# Patient Record
Sex: Female | Born: 1975
Health system: Southern US, Community
[De-identification: ages and names within clinical notes are randomized; demographics above are authoritative.]

## PROBLEM LIST (undated history)

## (undated) DIAGNOSIS — J45909 Unspecified asthma, uncomplicated: Secondary | ICD-10-CM

## (undated) DIAGNOSIS — F329 Major depressive disorder, single episode, unspecified: Secondary | ICD-10-CM

## (undated) DIAGNOSIS — T7840XA Allergy, unspecified, initial encounter: Secondary | ICD-10-CM

## (undated) DIAGNOSIS — F32A Depression, unspecified: Secondary | ICD-10-CM

## (undated) DIAGNOSIS — I10 Essential (primary) hypertension: Secondary | ICD-10-CM

## (undated) DIAGNOSIS — D649 Anemia, unspecified: Secondary | ICD-10-CM

## (undated) DIAGNOSIS — C20 Malignant neoplasm of rectum: Secondary | ICD-10-CM

## (undated) DIAGNOSIS — B009 Herpesviral infection, unspecified: Secondary | ICD-10-CM

## (undated) DIAGNOSIS — F419 Anxiety disorder, unspecified: Secondary | ICD-10-CM

## (undated) HISTORY — DX: Unspecified asthma, uncomplicated: J45.909

## (undated) HISTORY — DX: Anemia, unspecified: D64.9

## (undated) HISTORY — DX: Anxiety disorder, unspecified: F41.9

## (undated) HISTORY — DX: Major depressive disorder, single episode, unspecified: F32.9

## (undated) HISTORY — DX: Allergy, unspecified, initial encounter: T78.40XA

## (undated) HISTORY — DX: Herpesviral infection, unspecified: B00.9

## (undated) HISTORY — DX: Essential (primary) hypertension: I10

## (undated) HISTORY — DX: Malignant neoplasm of rectum: C20

## (undated) HISTORY — DX: Depression, unspecified: F32.A

---

## 2011-06-29 HISTORY — PX: COLONOSCOPY: SHX174

## 2011-06-29 HISTORY — PX: SIGMOIDOSCOPY: SUR1295

## 2012-07-13 DIAGNOSIS — Z803 Family history of malignant neoplasm of breast: Secondary | ICD-10-CM | POA: Insufficient documentation

## 2015-07-28 DIAGNOSIS — M797 Fibromyalgia: Secondary | ICD-10-CM | POA: Insufficient documentation

## 2016-06-15 HISTORY — PX: LAPAROSCOPIC VAGINAL HYSTERECTOMY: SUR798

## 2016-06-15 HISTORY — PX: PARTIAL HYSTERECTOMY: SHX80

## 2018-09-06 ENCOUNTER — Encounter: Payer: Self-pay | Admitting: Osteopathic Medicine

## 2018-09-06 ENCOUNTER — Ambulatory Visit (INDEPENDENT_AMBULATORY_CARE_PROVIDER_SITE_OTHER): Payer: No Typology Code available for payment source | Admitting: Osteopathic Medicine

## 2018-09-06 ENCOUNTER — Ambulatory Visit (INDEPENDENT_AMBULATORY_CARE_PROVIDER_SITE_OTHER): Payer: No Typology Code available for payment source

## 2018-09-06 ENCOUNTER — Other Ambulatory Visit: Payer: Self-pay

## 2018-09-06 VITALS — BP 121/86 | HR 63 | Temp 98.6°F | Ht 61.0 in | Wt 178.5 lb

## 2018-09-06 DIAGNOSIS — Z8739 Personal history of other diseases of the musculoskeletal system and connective tissue: Secondary | ICD-10-CM | POA: Insufficient documentation

## 2018-09-06 DIAGNOSIS — F329 Major depressive disorder, single episode, unspecified: Secondary | ICD-10-CM

## 2018-09-06 DIAGNOSIS — R0781 Pleurodynia: Secondary | ICD-10-CM

## 2018-09-06 DIAGNOSIS — H8110 Benign paroxysmal vertigo, unspecified ear: Secondary | ICD-10-CM

## 2018-09-06 DIAGNOSIS — D3A026 Benign carcinoid tumor of the rectum: Secondary | ICD-10-CM

## 2018-09-06 DIAGNOSIS — F419 Anxiety disorder, unspecified: Secondary | ICD-10-CM

## 2018-09-06 DIAGNOSIS — I10 Essential (primary) hypertension: Secondary | ICD-10-CM | POA: Diagnosis not present

## 2018-09-06 DIAGNOSIS — F32A Depression, unspecified: Secondary | ICD-10-CM

## 2018-09-06 MED ORDER — HYDROCHLOROTHIAZIDE 25 MG PO TABS: 12.5000 mg | ORAL_TABLET | Freq: Every day | ORAL | 3 refills | Status: DC

## 2018-09-06 MED ORDER — DULOXETINE HCL 30 MG PO CPEP: 30.0000 mg | ORAL_CAPSULE | Freq: Every day | ORAL | 3 refills | Status: DC

## 2018-09-06 MED ORDER — BUPROPION HCL ER (XL) 300 MG PO TB24: 300.0000 mg | ORAL_TABLET | Freq: Every day | ORAL | 3 refills | Status: DC

## 2018-09-06 NOTE — Progress Notes (Signed)
HPI: Kelly Simmons is a 43 y.o. female who  has a past medical history of Anxiety, Asthma, Depression, Herpes, High blood pressure, and Rectal carcinoma ([place]).  she presents to [facility] today, 09/06/18,  for chief complaint of: New to establish   Has been recently got a job with [employer], patient is here to establish care due to change of insurance.  Available records reviewed.  Recent annual physical with a Lia Hopping at [facility] 07/12/2018.  Anxiety/depression/fibromyalgia: Doing well on Wellbutrin and Cymbalta.  History of positive ANA, patient had been referred to rheumatology but no appointment as of time of last physical - pt states she went in the past and w/u neg   Hypertension: Well controlled on once daily hydrochlorothiazide 25 mg.  Patient reports cough over the past couple weeks, chest pain on the right lower rib cage with deep breathing.  History of dizziness/vertigo, occasional loss of balance with sudden movements.  Former smoker, less than 1 pack/day for 4 years, quit in 2002.  Rare marijuana use.  Frequent ED oil.  About 1 alcoholic drink a week.   Family history of father with stroke in the late 30s or early 50s, sounds like he also had some other medical issues in terms of renal failure and transplant patient.  Has a history of breast cancer at 33, patient's have so far been negative.  Hx rectal carcinoid     Past medical, surgical, social and family history reviewed:  Patient Active Problem List   Diagnosis Date Noted  . Benign paroxysmal positional vertigo 09/06/2018  . Essential hypertension 09/06/2018  . Anxiety and depression 09/06/2018  . History of fibromyalgia 09/06/2018    Past Surgical History:  Procedure Laterality Date  . CESAREAN SECTION  2009  . PARTIAL HYSTERECTOMY  06/15/2016    Social History   Tobacco Use  . Smoking status: Former Smoker    Packs/day: 0.50    Years: 4.00    Pack  years: 2.00  . Smokeless tobacco: Never Used  Substance Use Topics  . Alcohol use: Yes    Family History  Problem Relation Age of Onset  . High blood pressure Mother   . Breast cancer Mother   . High blood pressure Father   . Diabetes Father   . Stroke Father   . High blood pressure Sister   . High blood pressure Brother   . Alzheimer's disease Maternal Grandmother   . Prostate cancer Maternal Grandfather   . Alzheimer's disease Paternal Grandmother   . Heart attack Paternal Grandfather      Current medication list and allergy/intolerance information reviewed:    Current Outpatient Medications  Medication Sig Dispense Refill  . albuterol (PROVENTIL HFA;VENTOLIN HFA) 108 (90 Base) MCG/ACT inhaler Inhale into the lungs.    Marland Kitchen BIOTIN PO Take by mouth.    Marland Kitchen buPROPion (WELLBUTRIN XL) 300 MG 24 hr tablet Take 1 tablet (300 mg total) by mouth daily. 90 tablet 3  . Cholecalciferol 100 MCG (4000 UT) CAPS Take by mouth.    . DULoxetine (CYMBALTA) 30 MG capsule Take 1 capsule (30 mg total) by mouth daily. 90 capsule 3  . FERROUS FUMARATE PO Take by mouth.    . hydrochlorothiazide (HYDRODIURIL) 25 MG tablet Take 0.5 tablets (12.5 mg total) by mouth daily. 90 tablet 3   No current facility-administered medications for this visit.     No Known Allergies    Review of Systems:  Constitutional:  No  fever, no chills, No recent illness, No unintentional weight changes. +significant fatigue.   HEENT: No  headache, no vision change, no hearing change, No sore throat, No  sinus pressure  Cardiac: +pleuritic chest pain per HPI, No  pressure, No palpitations, No  Orthopnea  Respiratory:  No  shortness of breath. No  Cough  Gastrointestinal: No  abdominal pain, No  nausea, No  vomiting,  No  blood in stool, No  diarrhea, No  constipation   Musculoskeletal: No new myalgia/arthralgia  Skin: No  Rash, No other wounds/concerning lesions  Genitourinary: No  incontinence, No  abnormal genital  bleeding, No abnormal genital discharge  Hem/Onc: No  easy bruising/bleeding, No  abnormal lymph node  Endocrine: No cold intolerance,  No heat intolerance. No polyuria/polydipsia/polyphagia   Neurologic: No  weakness, +dizziness, No  slurred speech/focal weakness/facial droop  Psychiatric: +concerns with depression, +concerns with anxiety, No sleep problems, No mood problems  Exam:  BP 121/86 (BP Location: Left Arm, Patient Position: Sitting, Cuff Size: Normal)   Pulse 63   Temp 98.6 F (37 C) (Oral)   Ht 5\' 1"  (1.549 m)   Wt 178 lb 8 oz (81 kg)   BMI 33.73 kg/m   Constitutional: VS see above. General Appearance: alert, well-developed, well-nourished, NAD  Eyes: Normal lids and conjunctive, non-icteric sclera  Ears, Nose, Mouth, Throat: MMM, Normal external inspection ears/nares/mouth/lips/gums. TM normal bilaterally. Pharynx/tonsils no erythema, no exudate. Nasal mucosa normal.   Neck: No masses, trachea midline. No thyroid enlargement. No tenderness/mass appreciated. No lymphadenopathy  Respiratory: Normal respiratory effort. no wheeze, no rhonchi, no rales  Cardiovascular: S1/S2 normal, no murmur, no rub/gallop auscultated. RRR. No lower extremity edema.  Gastrointestinal: Nontender, no masses. No hepatomegaly, no splenomegaly. No hernia appreciated. Bowel sounds normal. Rectal exam deferred.   Musculoskeletal: Gait normal. No clubbing/cyanosis of digits.   Neurological: Normal balance/coordination. No tremor. No cranial nerve deficit on limited exam. Motor and sensation intact and symmetric. Cerebellar reflexes intact.   Skin: warm, dry, intact. No rash/ulcer. No concerning nevi or subq nodules on limited exam.    Psychiatric: Normal judgment/insight. Normal mood and affect. Oriented x3.    No results found for this or any previous visit (from the past 72 hour(s)).  Dg Chest 2 View  Result Date: 09/06/2018 CLINICAL DATA:  Pleuritic chest pain. EXAM: CHEST - 2 VIEW  COMPARISON:  None FINDINGS: The heart size and mediastinal contours are within normal limits. Both lungs are clear. Spondylosis noted within the thoracic spine. IMPRESSION: No active cardiopulmonary disease. Electronically Signed   By: Kerby Moors M.D.   On: 09/06/2018 16:28     ASSESSMENT/PLAN: The primary encounter diagnosis was Pleuritic chest pain. Diagnoses of Benign paroxysmal positional vertigo, unspecified laterality, Essential hypertension, Anxiety and depression, History of fibromyalgia, and Carcinoid tumor of rectum, unspecified whether malignant were also pertinent to this visit.   Orders Placed This Encounter  Procedures  . DG Chest 2 View    Meds ordered this encounter  Medications  . hydrochlorothiazide (HYDRODIURIL) 25 MG tablet    Sig: Take 0.5 tablets (12.5 mg total) by mouth daily.    Dispense:  90 tablet    Refill:  3  . DULoxetine (CYMBALTA) 30 MG capsule    Sig: Take 1 capsule (30 mg total) by mouth daily.    Dispense:  90 capsule    Refill:  3  . buPROPion (WELLBUTRIN XL) 300 MG 24 hr tablet    Sig: Take 1 tablet (  300 mg total) by mouth daily.    Dispense:  90 tablet    Refill:  3    Patient Instructions  Plan:  Chest Xray today to make sure no lung abnormality  Can try cutting BP medication (HCTZ) in half from 25 mg to 12.5 mg and see if this helps the dizziness/lightheadedness   Will refill meds  Bring home BP cuff into office to verify accuracy          Visit summary with medication list and pertinent instructions was printed for patient to review. All questions at time of visit were answered - patient instructed to contact office with any additional concerns or updates. ER/RTC precautions were reviewed with the patient.    Please note: voice recognition software was used to produce this document, and typos may escape review. Please contact Dr. Sheppard Coil for any needed clarifications.     Follow-up plan: Return for annual physical when  due around 06/2019,sooner as needed / depending on how BP looking at home  .

## 2018-09-06 NOTE — Patient Instructions (Addendum)
Plan:  Chest Xray today to make sure no lung abnormality  Can try cutting BP medication (HCTZ) in half from 25 mg to 12.5 mg and see if this helps the dizziness/lightheadedness   Will refill meds  Bring home BP cuff into office to verify accuracy

## 2018-09-07 ENCOUNTER — Encounter: Payer: Self-pay | Admitting: Osteopathic Medicine

## 2018-09-07 DIAGNOSIS — Z1239 Encounter for other screening for malignant neoplasm of breast: Secondary | ICD-10-CM

## 2018-09-09 DIAGNOSIS — D3A026 Benign carcinoid tumor of the rectum: Secondary | ICD-10-CM | POA: Insufficient documentation

## 2018-09-13 MED FILL — HYDROCHLOROTHIAZIDE 25 MG T: 25 | 30 days supply | Qty: 30 | Fill #0

## 2018-09-13 MED FILL — buPROPion HCL ER (XL) 300 M: 300 | 90 days supply | Qty: 90 | Fill #0

## 2018-09-17 ENCOUNTER — Encounter: Payer: Self-pay | Admitting: Osteopathic Medicine

## 2018-09-19 MED FILL — DULoxetine HCL 30 MG CPEP: 30 | 90 days supply | Qty: 90 | Fill #0

## 2018-11-08 ENCOUNTER — Ambulatory Visit (INDEPENDENT_AMBULATORY_CARE_PROVIDER_SITE_OTHER): Payer: No Typology Code available for payment source

## 2018-11-08 ENCOUNTER — Other Ambulatory Visit: Payer: Self-pay

## 2018-11-08 DIAGNOSIS — Z1239 Encounter for other screening for malignant neoplasm of breast: Secondary | ICD-10-CM | POA: Diagnosis not present

## 2018-11-21 MED FILL — HYDROCHLOROTHIAZIDE 25 MG T: 25 | 60 days supply | Qty: 60 | Fill #0

## 2019-02-08 MED FILL — HYDROCHLOROTHIAZIDE 25 MG T: 25 | 90 days supply | Qty: 90 | Fill #0

## 2019-02-08 MED FILL — DULoxetine HCL 30 MG CPEP: 30 | 90 days supply | Qty: 90 | Fill #0

## 2019-02-08 MED FILL — buPROPion HCL ER (XL) 300 M: 300 | 90 days supply | Qty: 90 | Fill #0

## 2019-03-28 ENCOUNTER — Ambulatory Visit (INDEPENDENT_AMBULATORY_CARE_PROVIDER_SITE_OTHER): Payer: No Typology Code available for payment source | Admitting: Sports Medicine

## 2019-03-28 DIAGNOSIS — Z23 Encounter for immunization: Secondary | ICD-10-CM

## 2019-05-01 ENCOUNTER — Other Ambulatory Visit: Payer: Self-pay

## 2019-05-01 DIAGNOSIS — Z20822 Contact with and (suspected) exposure to covid-19: Secondary | ICD-10-CM

## 2019-05-02 LAB — NOVEL CORONAVIRUS, NAA: SARS-CoV-2, NAA: NOT DETECTED

## 2019-07-04 MED FILL — HYDROCHLOROTHIAZIDE 25 MG T: 25 | 90 days supply | Qty: 90 | Fill #1

## 2019-07-04 MED FILL — DULoxetine HCL 30 MG CPEP: 30 | 90 days supply | Qty: 90 | Fill #1

## 2019-09-19 ENCOUNTER — Encounter: Payer: Self-pay | Admitting: Osteopathic Medicine

## 2019-09-19 ENCOUNTER — Other Ambulatory Visit: Payer: Self-pay

## 2019-09-19 ENCOUNTER — Ambulatory Visit (INDEPENDENT_AMBULATORY_CARE_PROVIDER_SITE_OTHER): Payer: No Typology Code available for payment source | Admitting: Osteopathic Medicine

## 2019-09-19 ENCOUNTER — Other Ambulatory Visit: Payer: Self-pay | Admitting: Osteopathic Medicine

## 2019-09-19 VITALS — BP 123/83 | HR 67 | Temp 98.2°F | Wt 180.1 lb

## 2019-09-19 DIAGNOSIS — F329 Major depressive disorder, single episode, unspecified: Secondary | ICD-10-CM

## 2019-09-19 DIAGNOSIS — Z13220 Encounter for screening for lipoid disorders: Secondary | ICD-10-CM

## 2019-09-19 DIAGNOSIS — R5382 Chronic fatigue, unspecified: Secondary | ICD-10-CM | POA: Diagnosis not present

## 2019-09-19 DIAGNOSIS — Z0000 Encounter for general adult medical examination without abnormal findings: Secondary | ICD-10-CM | POA: Diagnosis not present

## 2019-09-19 DIAGNOSIS — F419 Anxiety disorder, unspecified: Secondary | ICD-10-CM | POA: Diagnosis not present

## 2019-09-19 DIAGNOSIS — F32A Depression, unspecified: Secondary | ICD-10-CM

## 2019-09-19 MED ORDER — BUPROPION HCL ER (XL) 300 MG PO TB24: 300.0000 mg | ORAL_TABLET | Freq: Every day | ORAL | 3 refills | Status: DC

## 2019-09-19 MED ORDER — HYDROCHLOROTHIAZIDE 25 MG PO TABS: 12.5000 mg | ORAL_TABLET | Freq: Every day | ORAL | 3 refills | Status: DC

## 2019-09-19 MED ORDER — DULOXETINE HCL 30 MG PO CPEP: 30.0000 mg | ORAL_CAPSULE | Freq: Every day | ORAL | 3 refills | Status: DC

## 2019-09-19 MED FILL — DULoxetine HCL 30 MG CPEP: 30 | 90 days supply | Qty: 90 | Fill #0

## 2019-09-19 MED FILL — buPROPion HCL ER (XL) 300 M: 300 | 90 days supply | Qty: 90 | Fill #0

## 2019-09-19 MED FILL — HYDROCHLOROTHIAZIDE 25 MG T: 25 | 90 days supply | Qty: 90 | Fill #0

## 2019-09-19 NOTE — Patient Instructions (Addendum)
General Preventive Care  Most recent routine screening lipids/other labs: ordered    Everyone should have blood pressure checked once per year.   Tobacco: don't!   Alcohol: responsible moderation is ok for most adults - if you have concerns about your alcohol intake, please talk to me!   Exercise: as tolerated to reduce risk of cardiovascular disease and diabetes. Strength training will also prevent osteoporosis.   Mental health: if need for mental health care (medicines, counseling, other), or concerns about moods, please let me know!   Sexual health: if need for STD testing, or if concerns with libido/pain problems, please let me know!   Advanced Directive: Living Will and/or Healthcare Power of Attorney recommended for all adults, regardless of age or health.  Vaccines  Flu vaccine: recommended for almost everyone, every fall.   Shingles vaccine: Shingrix  age 33.  Pneumonia vaccines: Prevnar and Pneumovax age 26  Tetanus booster: Tdap recommended every 10 years. Due 2030  COVID vaccine: as scheduled! Thanks for getting your vaccine!  Cancer screenings   Colon cancer screening: age 40!  Breast cancer screening: mammogram at age 79 every other year at least, and annually after age 36.   Cervical cancer screening: Pap every 1 to 5 years depending on age and other risk factors. Can usually stop at age 33 or w/ hysterectomy.   Lung cancer screening: CT chest every year for those sge 40 to 46 years old with ?30 pack year smoking history, who either currently smoke or have quit within the past 15 years. Infection screenings . HIV: recommended screening at least once age 7-65, more often as needed. . Gonorrhea/Chlamydia: screening as needed . Hepatitis C: recommended for anyone born 34-1967 . TB: certain at-risk populations, or depending on work requirements and/or travel history Other . Bone Density Test: for women at age 47

## 2019-09-19 NOTE — Progress Notes (Signed)
HPI: Kelly Simmons is a 44 y.o. female who  has a past medical history of Anxiety, Asthma, Depression, Herpes, High blood pressure, and Rectal carcinoma ([place]).  she presents to [facility] today, 09/19/19,  for chief complaint of: Annual physical  Fatigue    Past medical, surgical, social and family history reviewed:  Patient Active Problem List   Diagnosis Date Noted  . Carcinoid tumor of rectum 09/09/2018  . Benign paroxysmal positional vertigo 09/06/2018  . Essential hypertension 09/06/2018  . Anxiety and depression 09/06/2018  . History of fibromyalgia 09/06/2018    Past Surgical History:  Procedure Laterality Date  . CESAREAN SECTION  2009  . PARTIAL HYSTERECTOMY  06/15/2016    Social History   Tobacco Use  . Smoking status: Former Smoker    Packs/day: 0.50    Years: 4.00    Pack years: 2.00  . Smokeless tobacco: Never Used  Substance Use Topics  . Alcohol use: Yes    Family History  Problem Relation Age of Onset  . High blood pressure Mother   . Breast cancer Mother   . High blood pressure Father   . Diabetes Father   . Stroke Father   . High blood pressure Sister   . High blood pressure Brother   . Alzheimer's disease Maternal Grandmother   . Prostate cancer Maternal Grandfather   . Alzheimer's disease Paternal Grandmother   . Heart attack Paternal Grandfather      Current medication list and allergy/intolerance information reviewed:    Current Outpatient Medications  Medication Sig Dispense Refill  . albuterol (PROVENTIL HFA;VENTOLIN HFA) 108 (90 Base) MCG/ACT inhaler Inhale into the lungs.    Marland Kitchen BIOTIN PO Take by mouth.    Marland Kitchen buPROPion (WELLBUTRIN XL) 300 MG 24 hr tablet Take 1 tablet (300 mg total) by mouth daily. 90 tablet 3  . Cholecalciferol 100 MCG (4000 UT) CAPS Take by mouth.    . DULoxetine (CYMBALTA) 30 MG capsule Take 1 capsule (30 mg total) by mouth daily. 90 capsule 3  . FERROUS FUMARATE PO Take  by mouth.    . hydrochlorothiazide (HYDRODIURIL) 25 MG tablet Take 0.5 tablets (12.5 mg total) by mouth daily. 90 tablet 3   No current facility-administered medications for this visit.    No Known Allergies     Exam:  BP 123/83 (BP Location: Left Arm, Patient Position: Sitting, Cuff Size: Normal)   Pulse 67   Temp 98.2 F (36.8 C) (Oral)   Wt 180 lb 1.9 oz (81.7 kg)   BMI 34.03 kg/m   Constitutional: VS see above. General Appearance: alert, well-developed, well-nourished, NAD  Eyes: Normal lids and conjunctive, non-icteric sclera  Ears, Nose, Mouth, Throat: . TM normal bilaterally.   Neck: No masses, trachea midline. No thyroid enlargement. No tenderness/mass appreciated. No lymphadenopathy  Respiratory: Normal respiratory effort. no wheeze, no rhonchi, no rales  Cardiovascular: S1/S2 normal, no murmur, no rub/gallop auscultated. RRR. No lower extremity edema.   Gastrointestinal: Nontender, no masses. No hepatomegaly, no splenomegaly. No hernia appreciated. Bowel sounds normal. Rectal exam deferred.   Musculoskeletal: Gait normal. No clubbing/cyanosis of digits.   Neurological: Normal balance/coordination. No tremor. No cranial nerve deficit on limited exam. Motor and sensation intact and symmetric. Cerebellar reflexes intact.   Skin: warm, dry, intact. No rash/ulcer. No concerning nevi or subq nodules on limited exam.    Psychiatric: Normal judgment/insight. Normal mood and affect. Oriented x3.    No results found for this  or any previous visit (from the past 72 hour(s)).  No results found.   ASSESSMENT/PLAN: The primary encounter diagnosis was Annual physical exam. Diagnoses of Chronic fatigue, Lipid screening, and Anxiety and depression were also pertinent to this visit.   Orders Placed This Encounter  Procedures  . CBC  . COMPLETE METABOLIC PANEL WITH GFR  . Lipid panel  . TSH  . Hemoglobin A1c    Meds ordered this encounter  Medications  . buPROPion  (WELLBUTRIN XL) 300 MG 24 hr tablet    Sig: Take 1 tablet (300 mg total) by mouth daily.    Dispense:  90 tablet    Refill:  3  . DULoxetine (CYMBALTA) 30 MG capsule    Sig: Take 1 capsule (30 mg total) by mouth daily.    Dispense:  90 capsule    Refill:  3  . hydrochlorothiazide (HYDRODIURIL) 25 MG tablet    Sig: Take 0.5 tablets (12.5 mg total) by mouth daily.    Dispense:  90 tablet    Refill:  3    Patient Instructions  General Preventive Care  Most recent routine screening lipids/other labs: ordered    Everyone should have blood pressure checked once per year.   Tobacco: don't!   Alcohol: responsible moderation is ok for most adults - if you have concerns about your alcohol intake, please talk to me!   Exercise: as tolerated to reduce risk of cardiovascular disease and diabetes. Strength training will also prevent osteoporosis.   Mental health: if need for mental health care (medicines, counseling, other), or concerns about moods, please let me know!   Sexual health: if need for STD testing, or if concerns with libido/pain problems, please let me know!   Advanced Directive: Living Will and/or Healthcare Power of Attorney recommended for all adults, regardless of age or health.  Vaccines  Flu vaccine: recommended for almost everyone, every fall.   Shingles vaccine: Shingrix  age 60.  Pneumonia vaccines: Prevnar and Pneumovax age 72  Tetanus booster: Tdap recommended every 10 years. Due 2030  COVID vaccine: as scheduled! Thanks for getting your vaccine!  Cancer screenings   Colon cancer screening: age 32!  Breast cancer screening: mammogram at age 47 every other year at least, and annually after age 59.   Cervical cancer screening: Pap every 1 to 5 years depending on age and other risk factors. Can usually stop at age 44 or w/ hysterectomy.   Lung cancer screening: CT chest every year for those sge 63 to 46 years old with ?30 pack year smoking history, who either  currently smoke or have quit within the past 15 years. Infection screenings . HIV: recommended screening at least once age 73-65, more often as needed. . Gonorrhea/Chlamydia: screening as needed . Hepatitis C: recommended for anyone born 35-1967 . TB: certain at-risk populations, or depending on work requirements and/or travel history Other . Bone Density Test: for women at age 15        Visit summary with medication list and pertinent instructions was printed for patient to review. All questions at time of visit were answered - patient instructed to contact office with any additional concerns or updates. ER/RTC precautions were reviewed with the patient.    Please note: voice recognition software was used to produce this document, and typos may escape review. Please contact Dr. Sheppard Coil for any needed clarifications.     Follow-up plan: Return in about 1 week (around 09/26/2019) for [place] (call week prior to visit  for lab orders).

## 2019-09-21 ENCOUNTER — Encounter: Payer: Self-pay | Admitting: Osteopathic Medicine

## 2019-09-22 LAB — IRON,TIBC AND FERRITIN PANEL
%SAT: 20 % (calc) (ref 16–45)
Ferritin: 48 ng/mL (ref 16–232)
Iron: 55 ug/dL (ref 40–190)
TIBC: 279 mcg/dL (calc) (ref 250–450)

## 2019-09-22 LAB — TSH: TSH: 0.55 mIU/L

## 2019-09-22 LAB — HEMOGLOBIN A1C
Hgb A1c MFr Bld: 5.4 % of total Hgb (ref <5.7)
Mean Plasma Glucose: 108 (calc)
eAG (mmol/L): 6 (calc)

## 2019-09-22 LAB — COMPLETE METABOLIC PANEL WITH GFR
AG Ratio: 1.7 (calc) (ref 1.0–2.5)
ALT: 10 U/L (ref 6–29)
AST: 14 U/L (ref 10–35)
Albumin: 4.5 g/dL (ref 3.6–5.1)
Alkaline phosphatase (APISO): 44 U/L (ref 31–125)
BUN: 14 mg/dL (ref 7–25)
CO2: 30 mmol/L (ref 20–32)
Calcium: 9.7 mg/dL (ref 8.6–10.2)
Chloride: 99 mmol/L (ref 98–110)
Creat: 1.03 mg/dL (ref 0.50–1.10)
GFR, Est African American: 76 mL/min/{1.73_m2} (ref >=60)
GFR, Est Non African American: 66 mL/min/{1.73_m2} (ref >=60)
Globulin: 2.6 g/dL (calc) (ref 1.9–3.7)
Glucose, Bld: 86 mg/dL (ref 65–139)
Potassium: 3.6 mmol/L (ref 3.5–5.3)
Sodium: 137 mmol/L (ref 135–146)
Total Bilirubin: 0.3 mg/dL (ref 0.2–1.2)
Total Protein: 7.1 g/dL (ref 6.1–8.1)

## 2019-09-22 LAB — CBC
HCT: 39.8 % (ref 35.0–45.0)
Hemoglobin: 12.7 g/dL (ref 11.7–15.5)
MCH: 24 pg — ABNORMAL LOW (ref 27.0–33.0)
MCHC: 31.9 g/dL — ABNORMAL LOW (ref 32.0–36.0)
MCV: 75.2 fL — ABNORMAL LOW (ref 80.0–100.0)
MPV: 11.5 fL (ref 7.5–12.5)
Platelets: 382 10*3/uL (ref 140–400)
RBC: 5.29 10*6/uL — ABNORMAL HIGH (ref 3.80–5.10)
RDW: 14.4 % (ref 11.0–15.0)
WBC: 4.8 10*3/uL (ref 3.8–10.8)

## 2019-09-22 LAB — LIPID PANEL
Cholesterol: 221 mg/dL — ABNORMAL HIGH (ref <200)
HDL: 58 mg/dL (ref >=50)
LDL Cholesterol (Calc): 140 mg/dL (calc) — ABNORMAL HIGH
Non-HDL Cholesterol (Calc): 163 mg/dL (calc) — ABNORMAL HIGH (ref <130)
Total CHOL/HDL Ratio: 3.8 (calc) (ref <5.0)
Triglycerides: 116 mg/dL (ref <150)

## 2019-09-26 ENCOUNTER — Other Ambulatory Visit: Payer: Self-pay | Admitting: Osteopathic Medicine

## 2019-09-26 DIAGNOSIS — Z1231 Encounter for screening mammogram for malignant neoplasm of breast: Secondary | ICD-10-CM

## 2019-10-11 ENCOUNTER — Encounter: Payer: Self-pay | Admitting: Osteopathic Medicine

## 2019-10-11 DIAGNOSIS — Z1211 Encounter for screening for malignant neoplasm of colon: Secondary | ICD-10-CM

## 2019-10-11 NOTE — Telephone Encounter (Signed)
Routing to provider  

## 2019-10-29 ENCOUNTER — Other Ambulatory Visit: Payer: Self-pay

## 2019-10-29 ENCOUNTER — Encounter: Payer: Self-pay | Admitting: Obstetrics & Gynecology

## 2019-10-29 ENCOUNTER — Other Ambulatory Visit (HOSPITAL_COMMUNITY)
Admission: RE | Admit: 2019-10-29 | Discharge: 2019-10-29 | Disposition: A | Discharge status: Home / Self Care | Payer: No Typology Code available for payment source | Source: Ambulatory Visit | Attending: Obstetrics & Gynecology | Admitting: Obstetrics & Gynecology

## 2019-10-29 ENCOUNTER — Ambulatory Visit (INDEPENDENT_AMBULATORY_CARE_PROVIDER_SITE_OTHER): Payer: No Typology Code available for payment source | Admitting: Obstetrics & Gynecology

## 2019-10-29 VITALS — BP 133/87 | HR 64 | Wt 180.0 lb

## 2019-10-29 DIAGNOSIS — Z01419 Encounter for gynecological examination (general) (routine) without abnormal findings: Secondary | ICD-10-CM | POA: Diagnosis not present

## 2019-10-29 DIAGNOSIS — Z90710 Acquired absence of both cervix and uterus: Secondary | ICD-10-CM | POA: Diagnosis not present

## 2019-10-29 NOTE — Patient Instructions (Signed)

## 2019-10-29 NOTE — Progress Notes (Signed)
GYNECOLOGY ANNUAL PREVENTATIVE CARE ENCOUNTER NOTE  History:     Kelly Simmons is a 44 y.o. female here for a routine annual gynecologic exam and to establish care.  Current complaints: none. Had LAVH in 2017 for AUB, adneomyosis; ovaries are still in place.   Denies abnormal vaginal bleeding, discharge, pelvic pain, problems with intercourse or other gynecologic concerns.    Gynecologic History No LMP recorded. Patient has had a hysterectomy. Contraception: status post hysterectomy Last Pap: 2020. Results were: normal with negative HPV Last mammogram: 2020. Results were: normal  Obstetric History OB History  No obstetric history on file.    Past Medical History:  Diagnosis Date  . Anxiety   . Asthma   . Depression   . Herpes   . High blood pressure   . Rectal carcinoma [facility])     Past Surgical History:  Procedure Laterality Date  . CESAREAN SECTION  2009  . LAPAROSCOPIC VAGINAL HYSTERECTOMY  06/15/2016   Ovaries still in place    Current Outpatient Medications on File Prior to Visit  Medication Sig Dispense Refill  . albuterol (PROVENTIL HFA;VENTOLIN HFA) 108 (90 Base) MCG/ACT inhaler Inhale into the lungs.    Marland Kitchen buPROPion (WELLBUTRIN XL) 300 MG 24 hr tablet Take 1 tablet (300 mg total) by mouth daily. 90 tablet 3  . Cholecalciferol 100 MCG (4000 UT) CAPS Take by mouth.    . DULoxetine (CYMBALTA) 30 MG capsule Take 1 capsule (30 mg total) by mouth daily. 90 capsule 3  . FERROUS FUMARATE PO Take by mouth.    . hydrochlorothiazide (HYDRODIURIL) 25 MG tablet Take 0.5 tablets (12.5 mg total) by mouth daily. 90 tablet 3  . BIOTIN PO Take by mouth.     No current facility-administered medications on file prior to visit.    No Known Allergies  Social History:  reports that she has quit smoking. She has a 2.00 pack-year smoking history. She has never used smokeless tobacco. She reports current alcohol use. She reports current drug use. Drug: Marijuana.  Family  History  Problem Relation Age of Onset  . High blood pressure Mother   . Breast cancer Mother   . High blood pressure Father   . Diabetes Father   . Stroke Father   . High blood pressure Sister   . High blood pressure Brother   . Alzheimer's disease Maternal Grandmother   . Prostate cancer Maternal Grandfather   . Alzheimer's disease Paternal Grandmother   . Heart attack Paternal Grandfather     The following portions of the patient's history were reviewed and updated as appropriate: allergies, current medications, past family history, past medical history, past social history, past surgical history and problem list.  Review of Systems Pertinent items noted in HPI and remainder of comprehensive ROS otherwise negative.  Physical Exam:  BP 133/87   Pulse 64   Wt 180 lb (81.6 kg)   BMI 34.01 kg/m  CONSTITUTIONAL: Well-developed, well-nourished female in no acute distress.  HENT:  Normocephalic, atraumatic, External right and left ear normal. Oropharynx is clear and moist EYES: Conjunctivae and EOM are normal. Pupils are equal, round, and reactive to light. No scleral icterus.  NECK: Normal range of motion, supple, no masses.  Normal thyroid.  SKIN: Skin is warm and dry. No rash noted. Not diaphoretic. No erythema. No pallor. MUSCULOSKELETAL: Normal range of motion. No tenderness.  No cyanosis, clubbing, or edema.  2+ distal pulses. NEUROLOGIC: Alert and oriented to person, place, and  time. Normal reflexes, muscle tone coordination.  PSYCHIATRIC: Normal mood and affect. Normal behavior. Normal judgment and thought content. CARDIOVASCULAR: Normal heart rate noted, regular rhythm RESPIRATORY: Clear to auscultation bilaterally. Effort and breath sounds normal, no problems with respiration noted. BREASTS: Symmetric in size. No masses, tenderness, skin changes, nipple drainage, or lymphadenopathy bilaterally. Performed in the presence of a chaperone. ABDOMEN: Soft, no distention noted.   No tenderness, rebound or guarding.  PELVIC: Normal appearing external genitalia and urethral meatus; normal appearing vaginal mucosa and well-healed vaginal cuff.  White discharge noted., testing sample obtained.  No palpable masses, no adnexal tenderness.  Performed in the presence of a chaperone.   Assessment and Plan:      1. Well woman exam with routine gynecological exam 2. S/P laparoscopic assisted vaginal hysterectomy (LAVH) LAVH done for benign indications, no need for further pap smear/cervical cancer screening. Patient desires annual STI screen. - HIV Antibody (routine testing w rflx) - Hepatitis C Antibody - RPR - Cervicovaginal ancillary only( [place]) Will follow up results and manage accordingly. Mammogram scheduled for 5/21/202.1 Routine preventative health maintenance measures emphasized. Please refer to After Visit Summary for other counseling recommendations.      Verita Schneiders, MD, [place] for [employer], [place]

## 2019-10-30 LAB — CERVICOVAGINAL ANCILLARY ONLY
Bacterial Vaginitis (gardnerella): NEGATIVE
Candida Glabrata: NEGATIVE
Candida Vaginitis: NEGATIVE
Chlamydia: NEGATIVE
Comment: NEGATIVE
Comment: NEGATIVE
Comment: NEGATIVE
Comment: NEGATIVE
Comment: NEGATIVE
Comment: NORMAL
Neisseria Gonorrhea: NEGATIVE
Trichomonas: NEGATIVE

## 2019-10-30 LAB — HEPATITIS C ANTIBODY
Hepatitis C Ab: NONREACTIVE
SIGNAL TO CUT-OFF: 0.01 (ref <1.00)

## 2019-10-30 LAB — HIV ANTIBODY (ROUTINE TESTING W REFLEX): HIV 1&2 Ab, 4th Generation: NONREACTIVE

## 2019-10-30 LAB — RPR: RPR Ser Ql: NONREACTIVE

## 2019-11-14 ENCOUNTER — Other Ambulatory Visit: Payer: Self-pay

## 2019-11-14 ENCOUNTER — Ambulatory Visit (INDEPENDENT_AMBULATORY_CARE_PROVIDER_SITE_OTHER): Payer: No Typology Code available for payment source

## 2019-11-14 DIAGNOSIS — Z1231 Encounter for screening mammogram for malignant neoplasm of breast: Secondary | ICD-10-CM

## 2020-02-20 ENCOUNTER — Encounter: Payer: Self-pay | Admitting: Osteopathic Medicine

## 2020-02-20 DIAGNOSIS — K921 Melena: Secondary | ICD-10-CM

## 2020-02-20 DIAGNOSIS — Z1211 Encounter for screening for malignant neoplasm of colon: Secondary | ICD-10-CM

## 2020-02-20 DIAGNOSIS — D3A026 Benign carcinoid tumor of the rectum: Secondary | ICD-10-CM

## 2020-02-28 ENCOUNTER — Encounter: Payer: Self-pay | Admitting: Gastroenterology

## 2020-03-07 ENCOUNTER — Emergency Department (INDEPENDENT_AMBULATORY_CARE_PROVIDER_SITE_OTHER)
Admission: RE | Admit: 2020-03-07 | Discharge: 2020-03-07 | Disposition: A | Discharge status: Home / Self Care | Payer: No Typology Code available for payment source | Source: Ambulatory Visit

## 2020-03-07 ENCOUNTER — Other Ambulatory Visit: Payer: Self-pay

## 2020-03-07 VITALS — BP 137/88 | HR 75 | Temp 99.0°F | Resp 20 | Ht 61.0 in | Wt 150.0 lb

## 2020-03-07 DIAGNOSIS — J069 Acute upper respiratory infection, unspecified: Secondary | ICD-10-CM

## 2020-03-07 DIAGNOSIS — R197 Diarrhea, unspecified: Secondary | ICD-10-CM

## 2020-03-07 NOTE — ED Triage Notes (Addendum)
Cough, congestion, diarrhea, sore throat x 5 days Vaccinated

## 2020-03-07 NOTE — ED Provider Notes (Signed)
[name]    CSN: [id number] Arrival date & time: 03/07/20  0904      History   Chief Complaint Chief Complaint  Patient presents with  . Cough    HPI Kelly Simmons is a 44 y.o. female.   HPI  Kelly Simmons is a 44 y.o. female presenting to UC with c/o 5 days of nasal congestion, mildly productive cough, sore throat and 1 episode of watery diarrhea yesterday. Pt received the [employer] vaccine in April 2021.  She notes her 59yo and 69yo son have been sick this past week as well, tested negative for COVID.  Pt denies fever, chills, n/v but had a temp of 99*F in triage.    Past Medical History:  Diagnosis Date  . Anxiety   . Asthma   . Depression   . Herpes   . High blood pressure   . Rectal carcinoma [facility])     Patient Active Problem List   Diagnosis Date Noted  . Carcinoid tumor of rectum 09/09/2018  . Benign paroxysmal positional vertigo 09/06/2018  . Essential hypertension 09/06/2018  . Anxiety and depression 09/06/2018  . History of fibromyalgia 09/06/2018    Past Surgical History:  Procedure Laterality Date  . CESAREAN SECTION  2009  . LAPAROSCOPIC VAGINAL HYSTERECTOMY  06/15/2016   Ovaries still in place    OB History   No obstetric history on file.      Home Medications    Prior to Admission medications   Medication Sig Start Date End Date Taking? Authorizing Provider  albuterol (PROVENTIL HFA;VENTOLIN HFA) 108 (90 Base) MCG/ACT inhaler Inhale into the lungs. 06/02/15   [provider]  BIOTIN PO Take by mouth.    [provider]  buPROPion (WELLBUTRIN XL) 300 MG 24 hr tablet Take 1 tablet (300 mg total) by mouth daily. 09/19/19   Emeterio Reeve, DO  Cholecalciferol 100 MCG (4000 UT) CAPS Take by mouth. 07/04/17   [provider]  DULoxetine (CYMBALTA) 30 MG capsule Take 1 capsule (30 mg total) by mouth daily. 09/19/19   Emeterio Reeve, DO  FERROUS FUMARATE PO Take by mouth.    [provider]  hydrochlorothiazide (HYDRODIURIL) 25 MG tablet Take 0.5 tablets (12.5 mg total) by mouth daily. 09/19/19   Emeterio Reeve, DO    Family History Family History  Problem Relation Age of Onset  . High blood pressure Mother   . Breast cancer Mother   . High blood pressure Father   . Diabetes Father   . Stroke Father   . High blood pressure Sister   . High blood pressure Brother   . Alzheimer's disease Maternal Grandmother   . Prostate cancer Maternal Grandfather   . Alzheimer's disease Paternal Grandmother   . Heart attack Paternal Grandfather     Social History Social History   Tobacco Use  . Smoking status: Former Smoker    Packs/day: 0.50    Years: 4.00    Pack years: 2.00  . Smokeless tobacco: Never Used  Vaping Use  . Vaping Use: Every day  Substance Use Topics  . Alcohol use: Yes  . Drug use: Yes    Types: Marijuana     Allergies   Patient has no known allergies.   Review of Systems Review of Systems  Constitutional: Negative for chills and fever.  HENT: Positive for congestion and sore throat. Negative for ear pain, trouble swallowing and voice change.   Respiratory: Positive for cough. Negative for  shortness of breath.   Cardiovascular: Negative for chest pain and palpitations.  Gastrointestinal: Positive for diarrhea. Negative for abdominal pain, nausea and vomiting.  Musculoskeletal: Negative for arthralgias, back pain and myalgias.  Skin: Negative for rash.  Neurological: Negative for dizziness, light-headedness and headaches.  All other systems reviewed and are negative.    Physical Exam Triage Vital Signs ED Triage Vitals  Enc Vitals Group     BP 03/07/20 0924 137/88     Pulse Rate 03/07/20 0924 75     Resp 03/07/20 0925 20     Temp 03/07/20 0924 99 F (37.2 C)     Temp Source 03/07/20 0924 Oral     SpO2 03/07/20 0925 98 %     Weight 03/07/20 0925 150 lb (68 kg)     Height 03/07/20 0925 5\' 1"  (1.549 m)     Head Circumference --       Peak Flow --      Pain Score 03/07/20 0925 0     Pain Loc --      Pain Edu? --      Excl. in [place]? --    No data found.  Updated Vital Signs BP 137/88   Pulse 75   Temp 99 F (37.2 C) (Oral)   Resp 20   Ht 5\' 1"  (1.549 m)   Wt 150 lb (68 kg)   SpO2 98%   BMI 28.34 kg/m   Visual Acuity Right Eye Distance:   Left Eye Distance:   Bilateral Distance:    Right Eye Near:   Left Eye Near:    Bilateral Near:     Physical Exam Vitals and nursing note reviewed.  Constitutional:      General: She is not in acute distress.    Appearance: Normal appearance. She is well-developed. She is not ill-appearing, toxic-appearing or diaphoretic.  HENT:     Head: Normocephalic and atraumatic.     Right Ear: Tympanic membrane and ear canal normal.     Left Ear: Tympanic membrane and ear canal normal.     Nose: Nose normal.     Right Sinus: No maxillary sinus tenderness or frontal sinus tenderness.     Left Sinus: No maxillary sinus tenderness or frontal sinus tenderness.     Mouth/Throat:     Lips: Pink.     Mouth: Mucous membranes are moist.     Pharynx: Oropharynx is clear. Uvula midline.  Cardiovascular:     Rate and Rhythm: Normal rate and regular rhythm.  Pulmonary:     Effort: Pulmonary effort is normal. No respiratory distress.     Breath sounds: Normal breath sounds. No stridor. No wheezing, rhonchi or rales.  Abdominal:     General: There is no distension.     Palpations: Abdomen is soft.     Tenderness: There is no abdominal tenderness. There is no right CVA tenderness or left CVA tenderness.  Musculoskeletal:        General: Normal range of motion.     Cervical back: Normal range of motion.  Skin:    General: Skin is warm and dry.  Neurological:     Mental Status: She is alert and oriented to person, place, and time.  Psychiatric:        Behavior: Behavior normal.      UC Treatments / Results  Labs (all labs ordered are listed, but only abnormal results are  displayed) Labs Reviewed  SARS-COV-2 RNA,(COVID-19) QUALITATIVE NAAT    EKG  Radiology No results found.  Procedures Procedures (including critical care time)  Medications Ordered in UC Medications - No data to display  Initial Impression / Assessment and Plan / UC Course  I have reviewed the triage vital signs and the nursing notes.  Pertinent labs & imaging results that were available during my care of the patient were reviewed by me and considered in my medical decision making (see chart for details).     Pt appears well, NAD Normal abdominal exam No evidence of bacterial infection at this time. Encouraged f/u with PCP as needed COVID test sent to r/o breakthrough case AVS given  Final Clinical Impressions(s) / UC Diagnoses   Final diagnoses:  Acute upper respiratory infection  Diarrhea, unspecified type     Discharge Instructions      You may take 500mg  acetaminophen every 4-6 hours or in combination with ibuprofen 400-600mg  every 6-8 hours as needed for pain, inflammation, and fever.  Be sure to well hydrated with clear liquids and get at least 8 hours of sleep at night, preferably more while sick.   Please follow up with family medicine in 4-5 days if not improving, sooner if worsening.  You will be notified of only POSITIVE test results in 2-4 days. If negative, you can view your results on your free [facility] MyChart account.      ED Prescriptions    None     PDMP not reviewed this encounter.   Noe Gens, [place] 03/07/20 1036

## 2020-03-07 NOTE — Discharge Instructions (Signed)
  You may take 500mg  acetaminophen every 4-6 hours or in combination with ibuprofen 400-600mg  every 6-8 hours as needed for pain, inflammation, and fever.  Be sure to well hydrated with clear liquids and get at least 8 hours of sleep at night, preferably more while sick.   Please follow up with family medicine in 4-5 days if not improving, sooner if worsening.  You will be notified of only POSITIVE test results in 2-4 days. If negative, you can view your results on your free [facility] MyChart account.

## 2020-03-08 LAB — SARS-COV-2 RNA,(COVID-19) QUALITATIVE NAAT: SARS CoV2 RNA: NOT DETECTED

## 2020-03-18 MED FILL — HYDROCHLOROTHIAZIDE 25 MG T: 25 | 90 days supply | Qty: 90 | Fill #1

## 2020-03-18 MED FILL — buPROPion HCL ER (XL) 300 M: 300 | 90 days supply | Qty: 90 | Fill #1

## 2020-03-18 MED FILL — DULoxetine HCL 30 MG CPEP: 30 | 90 days supply | Qty: 90 | Fill #1

## 2020-03-20 ENCOUNTER — Encounter: Payer: Self-pay | Admitting: Medical-Surgical

## 2020-03-20 ENCOUNTER — Telehealth (INDEPENDENT_AMBULATORY_CARE_PROVIDER_SITE_OTHER): Payer: No Typology Code available for payment source | Admitting: Medical-Surgical

## 2020-03-20 VITALS — Temp 98.8°F

## 2020-03-20 DIAGNOSIS — H9201 Otalgia, right ear: Secondary | ICD-10-CM

## 2020-03-20 MED ORDER — AMOXICILLIN-POT CLAVULANATE 875-125 MG PO TABS: 1.0000 | ORAL_TABLET | Freq: Two times a day (BID) | ORAL | 0 refills | Status: DC

## 2020-03-20 NOTE — Progress Notes (Signed)
Virtual Visit via Video Note  I connected with Kelly Simmons on 03/20/20 at  3:00 PM EDT by a video enabled telemedicine application and verified that I am speaking with the correct person using two identifiers.   I discussed the limitations of evaluation and management by telemedicine and the availability of in person appointments. The patient expressed understanding and agreed to proceed.  Patient location: home Provider locations: office  Subjective:    CC: right otalgia  HPI: Pleasant 44 year old female presenting via MyChart video visit with reports of sinus symptoms over the weekend that got better with only residual cough, nasal congestion, and jaw/neck pain. Her right ear started hurting two days ago and the pain extends down the side of her neck. She feels like her ear is clogged up on the right side. Does have some tenderness of the lymph nodes on the right. Denies fever, chest pain, shortness of breath, and GI symptoms. Last COVID tested negative a couple of weeks ago. Has not been retested since symptoms developed. Saw the dentist yesterday who said he found nothing wrong to cause her jaw and ear pain. Has been using warm compresses on her ear which helps. No other alleviating measures identified. Taking Mucinex for congestion.   Past medical history, Surgical history, Family history not pertinant except as noted below, Social history, Allergies, and medications have been entered into the medical record, reviewed, and corrections made.   Review of Systems: See HPI for pertinent positives and negatives.   Objective:    General: Speaking clearly in complete sentences without any shortness of breath.  Alert and oriented x3.  Normal judgment. No apparent acute distress.  Impression and Recommendations:    1. Right ear pain Recommend COVID testing due to her upper respiratory symptoms. Treating empirically with Augmentin for otitis media. Continue Mucinex to manage congestion.  Increase PO fluids and rest. Conservative measures reviewed. Tylenol/Ibuprofen for pain as needed.   Return if symptoms worsen or fail to improve.  20 minutes of non face-to-face time was provided during this encounter.  I discussed the assessment and treatment plan with the patient. The patient was provided an opportunity to ask questions and all were answered. The patient agreed with the plan and demonstrated an understanding of the instructions.   The patient was advised to call back or seek an in-person evaluation if the symptoms worsen or if the condition fails to improve as anticipated.  Clearnce Sorrel, DNP, APRN, FNP-BC [facility]

## 2020-04-10 ENCOUNTER — Encounter: Payer: Self-pay | Admitting: Gastroenterology

## 2020-04-10 ENCOUNTER — Ambulatory Visit (INDEPENDENT_AMBULATORY_CARE_PROVIDER_SITE_OTHER): Payer: No Typology Code available for payment source | Admitting: Gastroenterology

## 2020-04-10 VITALS — BP 130/82 | HR 65 | Ht 61.5 in | Wt 173.2 lb

## 2020-04-10 DIAGNOSIS — Z1211 Encounter for screening for malignant neoplasm of colon: Secondary | ICD-10-CM | POA: Diagnosis not present

## 2020-04-10 DIAGNOSIS — L290 Pruritus ani: Secondary | ICD-10-CM

## 2020-04-10 DIAGNOSIS — D3A026 Benign carcinoid tumor of the rectum: Secondary | ICD-10-CM

## 2020-04-10 NOTE — Patient Instructions (Addendum)
If you are age 46 or older, your body mass index should be between 23-30. Your Body mass index is 32.21 kg/m. If this is out of the aforementioned range listed, please consider follow up with your Primary Care Provider.  If you are age 13 or younger, your body mass index should be between 19-25. Your Body mass index is 32.21 kg/m. If this is out of the aformentioned range listed, please consider follow up with your Primary Care Provider.   We have sent the following medications to your pharmacy for you to pick up at your convenience: Clenpiq   You have been scheduled for a colonoscopy. Please follow written instructions given to you at your visit today.  Please pick up your prep supplies at the pharmacy within the next 1-3 days. If you use inhalers (even only as needed), please bring them with you on the day of your procedure.   It was a pleasure to see you today!  Vito Cirigliano, D.O.   .[email or web address]

## 2020-04-10 NOTE — Progress Notes (Signed)
Chief Complaint: Discuss colon cancer screening/colonoscopy, symptomatic hemorrhoids   Referring Provider:     Emeterio Reeve, DO   HPI:     Kelly Simmons is a 44 y.o. female with a history of anxiety, asthma, HTN, rectal carcinoid, hysterectomy 2017, referred to the Gastro[facility]ology Clinic for evaluation of symptomatic hemorrhoids along with discussion of colon cancer screening/colonoscopy.  She has a history of rectal carcinoid tumor s/p endoscopic resection 2012 at [facility].  She does report intermittent symptomatic hemorrhoids, described as perianal itching. No hematochezia. She treats at home with witch hazel and Advil with good relief. Sxs have been present for last 6 months or so.   Underwent hysterectomy 2017 for iron deficiency anemia and menorrhagia.  Labs in 08/2019 with H/H 12.7/39.8, MCV/RDW 75/14.  Normal iron indices.  Normal CMP.  Endoscopic History: -Colonoscopy (09/2010, [facility]): 5 mm rectal carcinoid. -Flexible sigmoidoscopy (10/2010, Dr. Newman Pies, [facility]) 2 mm nearly healed ulcer in the distal rectum located 2 cm from the anal verge which may represent previous biopsy site.  Entire area removed with biopsy forceps.  Pathology revealed small focus of carcinoid tumor.  Recommended repeat flexible sigmoidoscopy in 6 months. -CT (2012): No evidence of metastatic disease.  Cavernous hemangiomas -Flexible sigmoidoscopy (04/2011, Dr. Newman Pies, [facility]): Scar in distal rectum 2 cm from dentate line.  No residual tumor.  Multiple biopsies obtained and negative for carcinoid. -Colonoscopy (04/2012, Dr. Newman Pies, [facility]): Small scar in distal rectum without residual carcinoid.  Otherwise normal colon.  Repeat colonoscopy at age 89 for routine screening.  Past Medical History:  Diagnosis Date  . Anxiety   . Asthma   . Depression   . Herpes   . High blood pressure   . Rectal carcinoma [facility])      Past Surgical History:  Procedure  Laterality Date  . CESAREAN SECTION  2009  . COLONOSCOPY  2013   6 months later from Sigmoidoscopy [facility]  . LAPAROSCOPIC VAGINAL HYSTERECTOMY  06/15/2016   Ovaries still in place  . SIGMOIDOSCOPY  2013   [place]   Family History  Problem Relation Age of Onset  . High blood pressure Mother   . Breast cancer Mother   . High blood pressure Father   . Diabetes Father   . Stroke Father   . Colon polyps Father   . Kidney disease Father   . High blood pressure Sister   . High blood pressure Brother   . Alzheimer's disease Maternal Grandmother   . Prostate cancer Maternal Grandfather   . Alzheimer's disease Paternal Grandmother   . Heart attack Paternal Grandfather   . Colon cancer Neg Hx   . Esophageal cancer Neg Hx    Social History   Tobacco Use  . Smoking status: Former Smoker    Packs/day: 0.50    Years: 4.00    Pack years: 2.00  . Smokeless tobacco: Never Used  Vaping Use  . Vaping Use: Never used  Substance Use Topics  . Alcohol use: Yes    Comment: ocassionally 1 a month  . Drug use: Yes    Types: Marijuana   Current Outpatient Medications  Medication Sig Dispense Refill  . albuterol (PROVENTIL HFA;VENTOLIN HFA) 108 (90 Base) MCG/ACT inhaler Inhale into the lungs.    Marland Kitchen BIOTIN PO Take by mouth.    Marland Kitchen buPROPion (WELLBUTRIN XL) 300 MG 24 hr tablet Take 1 tablet (300  mg total) by mouth daily. 90 tablet 3  . Cholecalciferol 100 MCG (4000 UT) CAPS Take by mouth.    . DULoxetine (CYMBALTA) 30 MG capsule Take 1 capsule (30 mg total) by mouth daily. 90 capsule 3  . FERROUS FUMARATE PO Take by mouth.    . hydrochlorothiazide (HYDRODIURIL) 25 MG tablet Take 0.5 tablets (12.5 mg total) by mouth daily. 90 tablet 3   No current facility-administered medications for this visit.   No Known Allergies   Review of Systems: All systems reviewed and negative except where noted in HPI.     Physical Exam:    Wt Readings from Last 3 Encounters:  04/10/20 173  lb 4 oz (78.6 kg)  03/07/20 150 lb (68 kg)  10/29/19 180 lb (81.6 kg)    BP 130/82   Pulse 65   Ht 5' 1.5" (1.562 m)   Wt 173 lb 4 oz (78.6 kg)   BMI 32.21 kg/m  Constitutional:  Pleasant, in no acute distress. Psychiatric: Normal mood and affect. Behavior is normal. EENT: Pupils normal.  Conjunctivae are normal. No scleral icterus. Neck supple. No cervical LAD. Cardiovascular: Normal rate, regular rhythm. No edema Pulmonary/chest: Effort normal and breath sounds normal. No wheezing, rales or rhonchi. Abdominal: Soft, nondistended, nontender. Bowel sounds active throughout. There are no masses palpable. No hepatomegaly. Neurological: Alert and oriented to person place and time. Skin: Skin is warm and dry. No rashes noted. Rectal: Exam deferred by patient to time of colonoscopy.    ASSESSMENT AND PLAN;   1) Colon Cancer Screening: -Per current guidelines, due for age-appropriate colon cancer screening -Schedule colonoscopy  2) Pruritis Ani: -Plan for peritoneal evaluation at time of colonoscopy.  If clinically significant hemorrhoids, discussed setting up for hemorrhoid banding -Otherwise, to continue conservative management  3) Hx of Rectal Carcinoid: -History of rectal carcinoid removed endoscopically in 2012.  Can certainly evaluate distal rectal scar site with additional biopsies as needed per endoscopic findings  The indications, risks, and benefits of colonoscopy were explained to the patient in detail. Risks include but are not limited to bleeding, perforation, adverse reaction to medications, and cardiopulmonary compromise. Sequelae include but are not limited to the possibility of surgery, hospitalization, and mortality. The patient verbalized understanding and wished to proceed. All questions answered, referred to the scheduler and bowel prep ordered. Further recommendations pending results of the exam.      Lavena Bullion, DO, FACG  04/10/2020, 9:32  AM   Emeterio Reeve, DO

## 2020-04-15 ENCOUNTER — Encounter: Payer: Self-pay | Admitting: Gastroenterology

## 2020-04-24 ENCOUNTER — Other Ambulatory Visit: Payer: Self-pay | Admitting: Gastroenterology

## 2020-04-24 ENCOUNTER — Telehealth: Payer: Self-pay | Admitting: Gastroenterology

## 2020-04-24 MED ORDER — CLENPIQ 10-3.5-12 MG-GM -GM/160ML PO SOLN: 1.0000 | ORAL | 0 refills | Status: DC

## 2020-04-24 MED FILL — CLENPIQ 10-3.5-12 MG-GM -GM: 10-3.5-12 M | 1 days supply | Qty: 320 | Fill #0

## 2020-04-24 NOTE — Telephone Encounter (Signed)
Patient called states she has not yet received the prep medication for upcoming procedure

## 2020-04-29 ENCOUNTER — Other Ambulatory Visit: Payer: Self-pay

## 2020-04-29 ENCOUNTER — Ambulatory Visit (AMBULATORY_SURGERY_CENTER): Payer: No Typology Code available for payment source | Admitting: Gastroenterology

## 2020-04-29 ENCOUNTER — Encounter: Payer: Self-pay | Admitting: Gastroenterology

## 2020-04-29 VITALS — BP 128/82 | HR 49 | Temp 97.1°F | Resp 16 | Ht 61.5 in | Wt 173.0 lb

## 2020-04-29 DIAGNOSIS — Z1211 Encounter for screening for malignant neoplasm of colon: Secondary | ICD-10-CM | POA: Diagnosis not present

## 2020-04-29 DIAGNOSIS — Z86012 Personal history of benign carcinoid tumor: Secondary | ICD-10-CM

## 2020-04-29 DIAGNOSIS — K5730 Diverticulosis of large intestine without perforation or abscess without bleeding: Secondary | ICD-10-CM

## 2020-04-29 DIAGNOSIS — K6289 Other specified diseases of anus and rectum: Secondary | ICD-10-CM | POA: Diagnosis not present

## 2020-04-29 MED ORDER — SODIUM CHLORIDE 0.9 % IV SOLN: 500.0000 mL | Freq: Once | INTRAVENOUS | Status: DC

## 2020-04-29 NOTE — Patient Instructions (Addendum)
Handout was given to you on diverticulosis. You may resume your current medications today. Await biopsy results.  Usually takes 2-3 weeks to receive that pathology results. Please call if any questions or concerns.     YOU HAD AN ENDOSCOPIC PROCEDURE TODAY AT [facility]:   Refer to the procedure report that was given to you for any specific questions about what was found during the examination.  If the procedure report does not answer your questions, please call your gastroenterologist to clarify.  If you requested that your care partner not be given the details of your procedure findings, then the procedure report has been included in a sealed envelope for you to review at your convenience later.  YOU SHOULD EXPECT: Some feelings of bloating in the abdomen. Passage of more gas than usual.  Walking can help get rid of the air that was put into your GI tract during the procedure and reduce the bloating. If you had a lower endoscopy (such as a colonoscopy or flexible sigmoidoscopy) you may notice spotting of blood in your stool or on the toilet paper. If you underwent a bowel prep for your procedure, you may not have a normal bowel movement for a few days.  Please Note:  You might notice some irritation and congestion in your nose or some drainage.  This is from the oxygen used during your procedure.  There is no need for concern and it should clear up in a day or so.  SYMPTOMS TO REPORT IMMEDIATELY:   Following lower endoscopy (colonoscopy or flexible sigmoidoscopy):  Excessive amounts of blood in the stool  Significant tenderness or worsening of abdominal pains  Swelling of the abdomen that is new, acute  Fever of 100F or higher    For urgent or emergent issues, a gastroenterologist can be reached at any hour by calling [phone]. Do not use MyChart messaging for urgent concerns.    DIET:  We do recommend a small meal at first, but then you may proceed to your  regular diet.  Drink plenty of fluids but you should avoid alcoholic beverages for 24 hours.  ACTIVITY:  You should plan to take it easy for the rest of today and you should NOT DRIVE or use heavy machinery until tomorrow (because of the sedation medicines used during the test).    FOLLOW UP: Our staff will call the number listed on your records 48-72 hours following your procedure to check on you and address any questions or concerns that you may have regarding the information given to you following your procedure. If we do not reach you, we will leave a message.  We will attempt to reach you two times.  During this call, we will ask if you have developed any symptoms of COVID 19. If you develop any symptoms (ie: fever, flu-like symptoms, shortness of breath, cough etc.) before then, please call [phone].  If you test positive for Covid 19 in the 2 weeks post procedure, please call and report this information to [place].    If any biopsies were taken you will be contacted by phone or by letter within the next 1-3 weeks.  Please call us at [phone] if you have not heard about the biopsies in 3 weeks.    SIGNATURES/CONFIDENTIALITY: You and/or your care partner have signed paperwork which will be entered into your electronic medical record.  These signatures attest to the fact that that the information above on your After Visit Summary has  been reviewed and is understood.  Full responsibility of the confidentiality of this discharge information lies with you and/or your care-partner.

## 2020-04-29 NOTE — Progress Notes (Signed)
Pt's states no medical or surgical changes since previsit or office visit.  VS CW  

## 2020-04-29 NOTE — Progress Notes (Signed)
Called to room to assist during endoscopic procedure.  Patient ID and intended procedure confirmed with present staff. Received instructions for my participation in the procedure from the performing physician.  

## 2020-04-29 NOTE — Progress Notes (Signed)
To PACU, VSS. Report to [email or web address] 

## 2020-04-29 NOTE — Op Note (Signed)
[facility] Patient Name: Kelly Simmons Procedure Date: 04/29/2020 9:52 AM MRN: [id number] Endoscopist: Gerrit Heck , MD Age: 44 Referring MD:  Date of Birth: [birth year] Gender: Female Account #: [id number] Procedure:                Colonoscopy Indications:              Screening for colorectal malignant neoplasm. She is                            otherwise without any active lower GI symptoms.                           Additionally, history of the following:                           ?"Colonoscopy (09/2010, [facility]): 5 mm rectal                            carcinoid.                           ?"Flexible sigmoidoscopy (10/2010, Dr. Newman Pies, [facility]) 2 mm nearly healed ulcer in the distal                            rectum located 2 cm from the anal verge which may                            represent previous biopsy site. Entire area removed                            with biopsy forceps. Pathology revealed small focus                            of carcinoid tumor. Recommended repeat flexible                            sigmoidoscopy in 6 months.                           ?"CT (2012): No evidence of metastatic disease.                            Cavernous hemangiomas                           ?"Flexible sigmoidoscopy (04/2011, Dr. Newman Pies, [facility]): Scar in distal rectum 2 cm from dentate                            line. No residual tumor. Multiple biopsies obtained  and negative for carcinoid.                           ?"Colonoscopy (04/2012, Dr. Newman Pies, [facility]):                            Small scar in distal rectum without residual                            carcinoid. Otherwise normal colon. Repeat                            colonoscopy at age 89 for routine screening. Medicines:                Monitored Anesthesia Care Procedure:                Pre-Anesthesia  Assessment:                           - Prior to the procedure, a History and Physical                            was performed, and patient medications and                            allergies were reviewed. The patient's tolerance of                            previous anesthesia was also reviewed. The risks                            and benefits of the procedure and the sedation                            options and risks were discussed with the patient.                            All questions were answered, and informed consent                            was obtained. Prior Anticoagulants: The patient has                            taken no previous anticoagulant or antiplatelet                            agents. ASA Grade Assessment: II - A patient with                            mild systemic disease. After reviewing the risks                            and benefits, the patient was deemed in  satisfactory condition to undergo the procedure.                           After obtaining informed consent, the colonoscope                            was passed under direct vision. Throughout the                            procedure, the patient's blood pressure, pulse, and                            oxygen saturations were monitored continuously. The                            Colonoscope was introduced through the anus and                            advanced to the the terminal ileum. The colonoscopy                            was performed without difficulty. The patient                            tolerated the procedure well. The quality of the                            bowel preparation was good. The terminal ileum,                            ileocecal valve, appendiceal orifice, and rectum                            were photographed. Scope In: 10:06:03 AM Scope Out: 10:23:42 AM Scope Withdrawal Time: 0 hours 14 minutes 18 seconds  Total Procedure  Duration: 0 hours 17 minutes 39 seconds  Findings:                 The perianal and digital rectal examinations were                            normal.                           A post polypectomy scar was found in the distal                            rectum. There was a subtle focal area of polypoid                            tissue. Biopsies were taken with a cold forceps for                            histology from this site, then biopsies collected  along the entire scar line. Estimated blood loss                            was minimal.                           A few small and large-mouthed diverticula were                            found in the ascending colon.                           The exam was otherwise normal throughout the                            remainder of the colon.                           The retroflexed view of the distal rectum and anal                            verge was normal and showed no anal or rectal                            abnormalities.                           The terminal ileum appeared normal. Complications:            No immediate complications. Estimated Blood Loss:     Estimated blood loss was minimal. Impression:               - Post-polypectomy scar in the distal rectum                            consistent with prior history of rectal carcinoid.                            Biopsied.                           - Diverticulosis in the ascending colon.                           - The distal rectum and anal verge are normal on                            retroflexion view.                           - The examined portion of the ileum was normal. Recommendation:           - Patient has a contact number available for                            emergencies. The signs and symptoms of potential  delayed complications were discussed with the                            patient. Return to normal  activities tomorrow.                            Written discharge instructions were provided to the                            patient.                           - Resume previous diet.                           - Continue present medications.                           - Await pathology results.                           - Repeat colonoscopy for surveillance based on                            pathology results. If rectal biopsies otherwise                            benign, repeat in 10 years for ongoing screening.                           - Return to GI clinic PRN. Gerrit Heck, MD 04/29/2020 10:32:19 AM

## 2020-04-29 NOTE — Progress Notes (Signed)
No problems noted in the recovery room. maw 

## 2020-05-01 ENCOUNTER — Telehealth: Payer: Self-pay | Admitting: *Deleted

## 2020-05-01 NOTE — Telephone Encounter (Signed)
°  Follow up Call-  Call back number 04/29/2020  Post procedure Call Back phone  # [phone]  Permission to leave phone message Yes     Patient questions:  Do you have a fever, pain , or abdominal swelling? No. Pain Score  0 *  Have you tolerated food without any problems? Yes.    Have you been able to return to your normal activities? Yes.    Do you have any questions about your discharge instructions: Diet   No. Medications  No. Follow up visit  No.  Do you have questions or concerns about your Care? No.  Actions: * If pain score is 4 or above: No action needed, pain <4.  1. Have you developed a fever since your procedure? no  2.   Have you had an respiratory symptoms (SOB or cough) since your procedure? no  3.   Have you tested positive for COVID 19 since your procedure no  4.   Have you had any family members/close contacts diagnosed with the COVID 19 since your procedure? no   If yes to any of these questions please route to Joylene John, RN and Joella Prince, RN

## 2020-06-13 IMAGING — MG DIGITAL SCREENING BILATERAL MAMMOGRAM WITH TOMO AND CAD
6 of 10 series · 6 of 30 positions shown · non-contrast
Comparison: Previous exam(s).

CLINICAL DATA: Screening.

EXAM:
DIGITAL SCREENING BILATERAL MAMMOGRAM WITH TOMO AND CAD

[R MLO synth-2D]
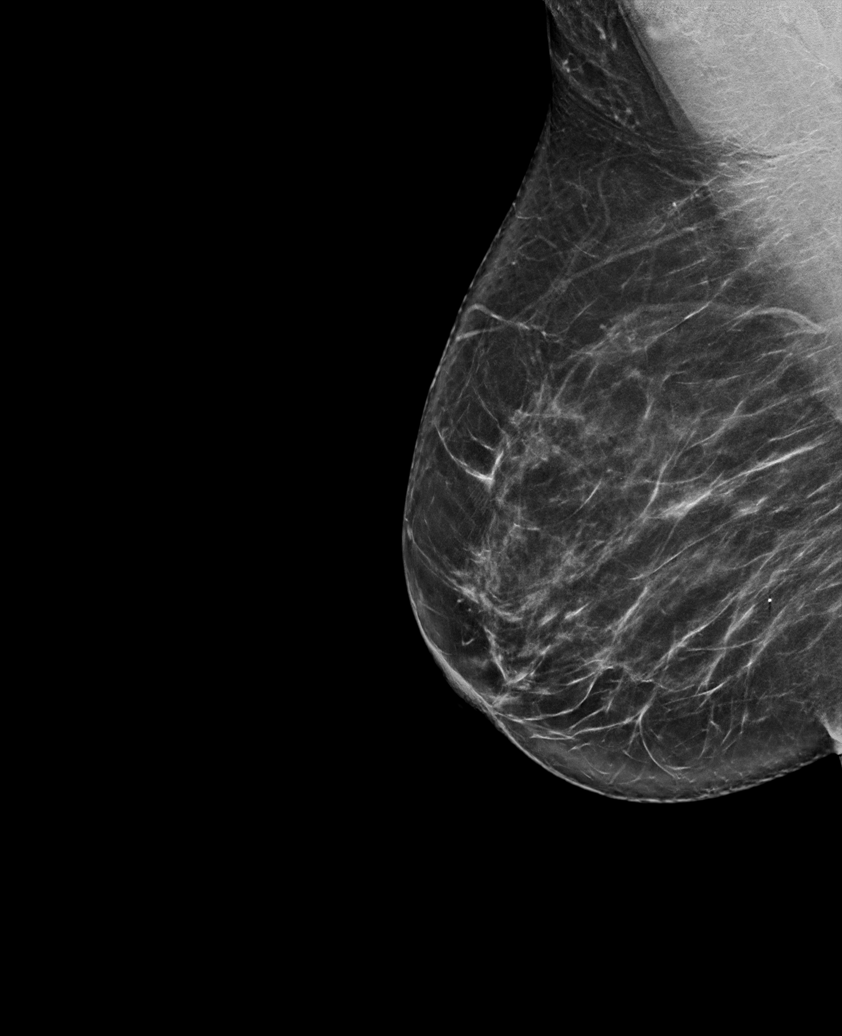

[R XCCL synth-2D]
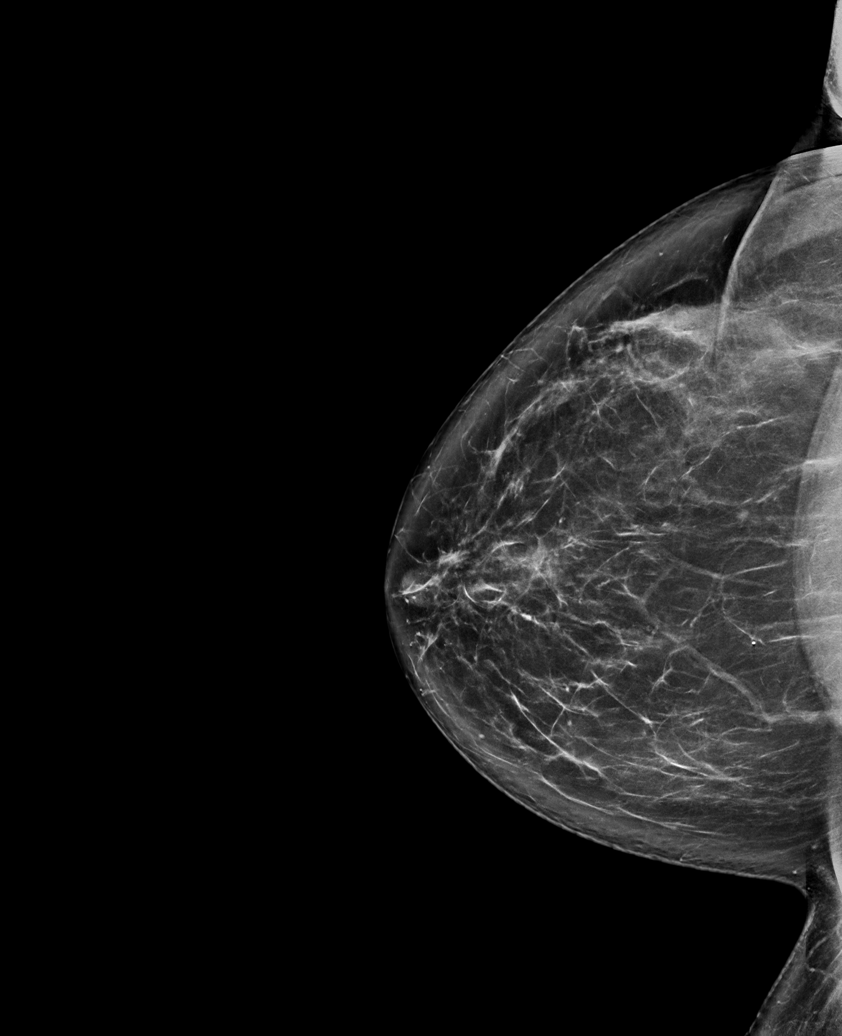

[L CC synth-2D]
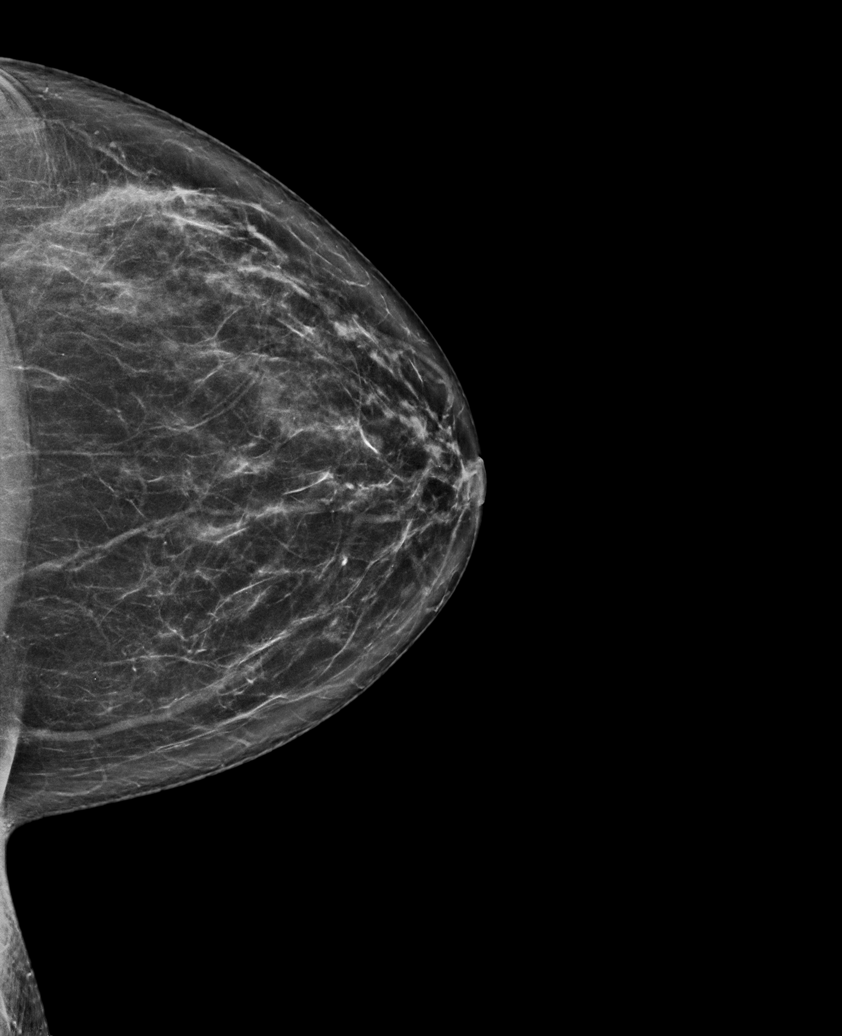

[R CC synth-2D]
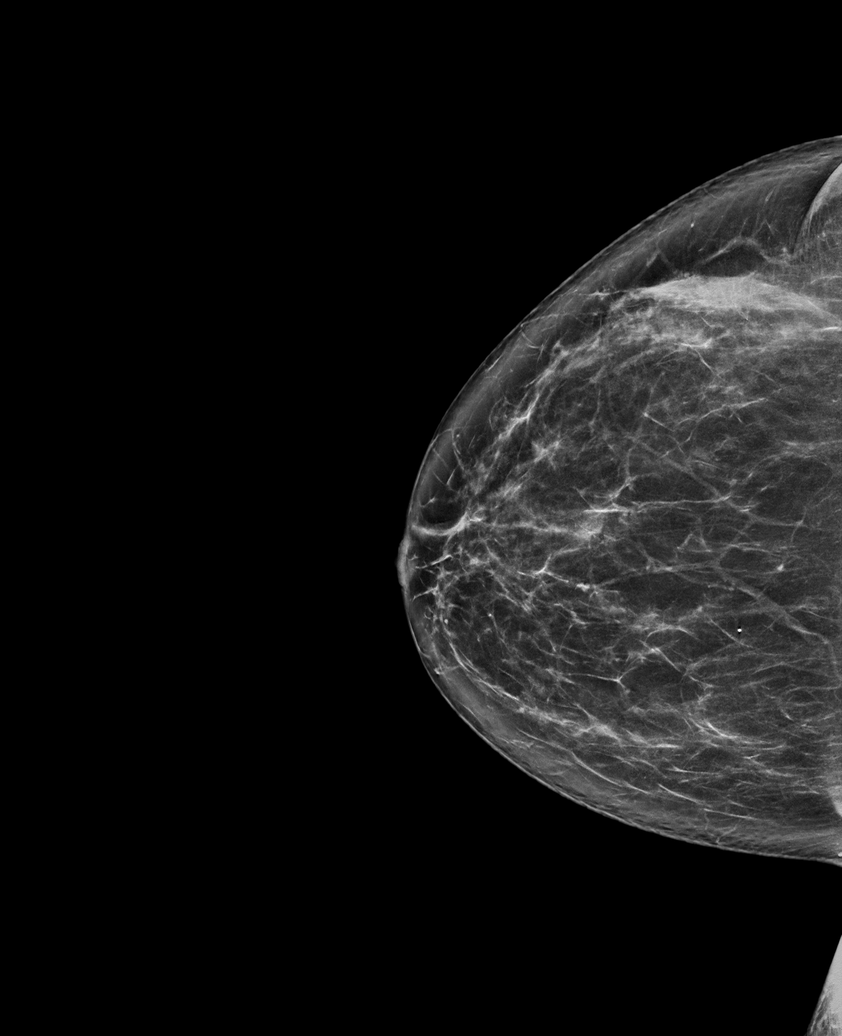

[L MLO synth-2D]
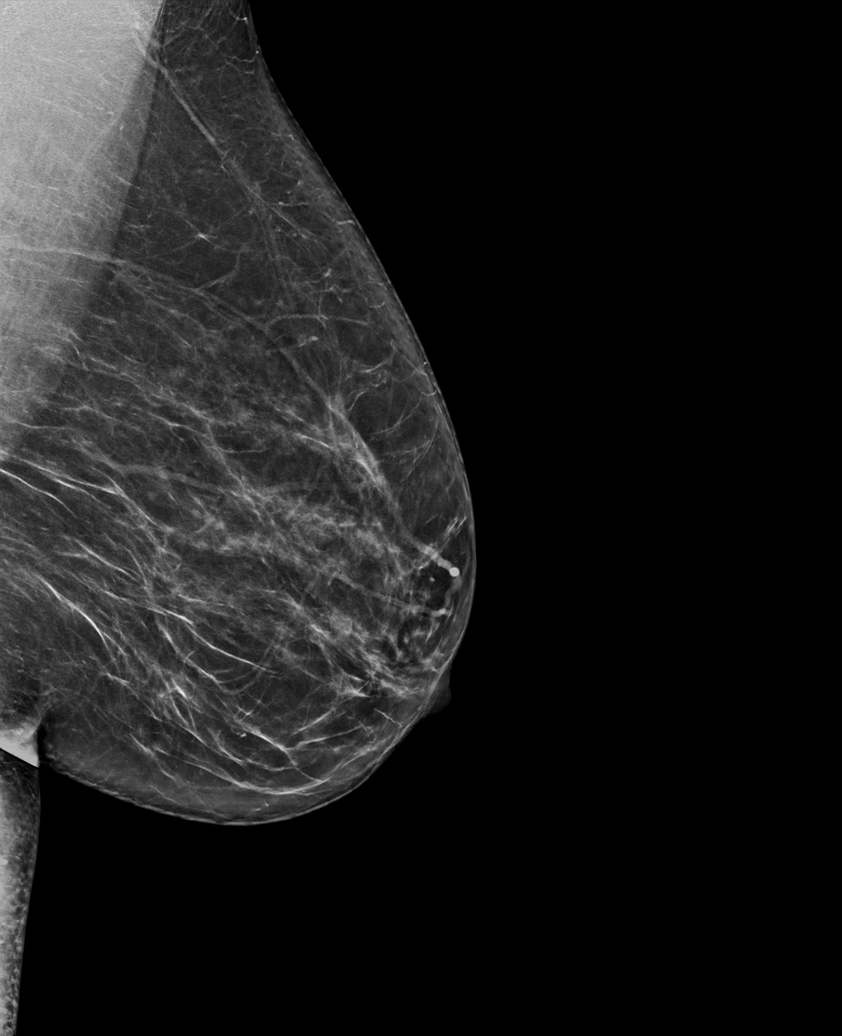

[L CC tomo · tomo slice 37/73.0]
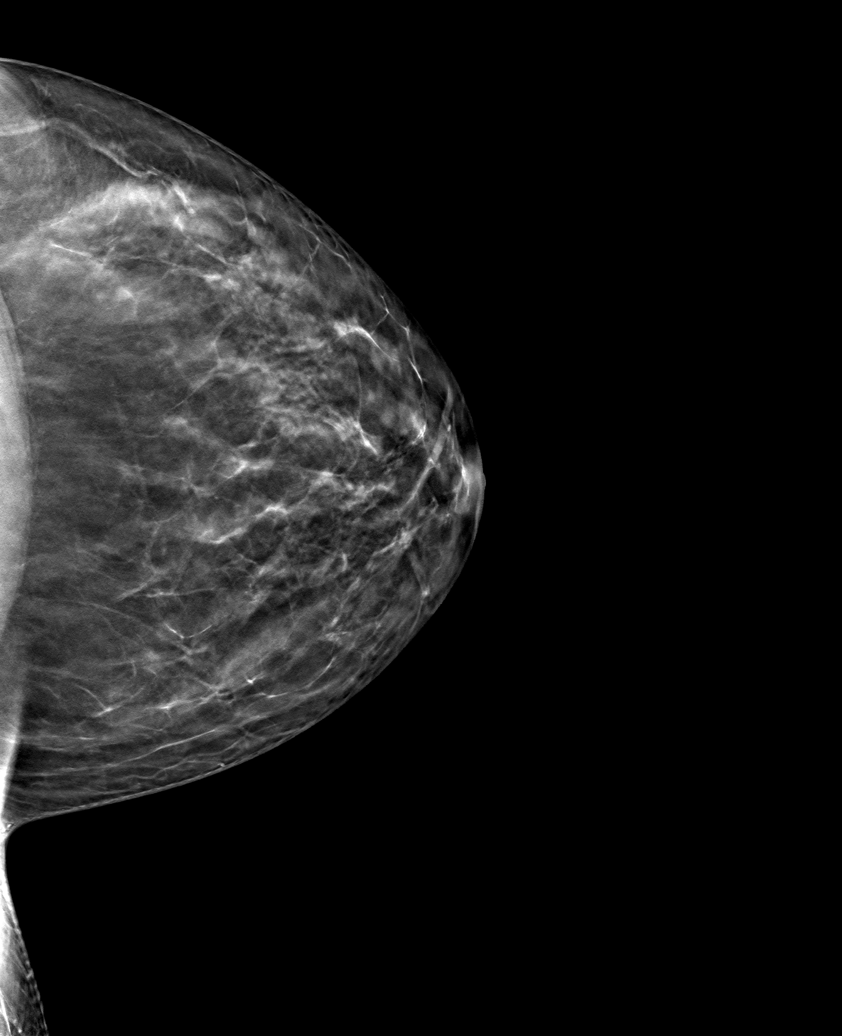

[6 of 30 positions shown; findings below may reference images not displayed]

ACR Breast Density Category b: There are scattered areas of
fibroglandular density.
FINDINGS: There are no findings suspicious for malignancy. Images were
processed with CAD.
IMPRESSION: No mammographic evidence of malignancy. A result letter of this
screening mammogram will be mailed directly to the patient.

RECOMMENDATION:
Screening mammogram in one year. (Code:[id number])

BI-RADS CATEGORY  1: Negative.

## 2020-07-09 ENCOUNTER — Encounter: Payer: Self-pay | Admitting: Osteopathic Medicine

## 2020-07-09 MED ORDER — VALACYCLOVIR HCL 1 G PO TABS: 2000.0000 mg | ORAL_TABLET | Freq: Two times a day (BID) | ORAL | 0 refills | Status: DC

## 2020-08-21 MED FILL — HYDROCHLOROTHIAZIDE 25 MG T: 25 | 90 days supply | Qty: 45 | Fill #2

## 2020-08-21 MED FILL — DULoxetine HCL 30 MG CPEP: 30 | 90 days supply | Qty: 90 | Fill #2

## 2020-08-21 MED FILL — buPROPion HCL ER (XL) 300 M: 300 | 90 days supply | Qty: 90 | Fill #2

## 2020-09-17 ENCOUNTER — Other Ambulatory Visit (HOSPITAL_BASED_OUTPATIENT_CLINIC_OR_DEPARTMENT_OTHER): Payer: Self-pay

## 2020-09-18 ENCOUNTER — Ambulatory Visit (INDEPENDENT_AMBULATORY_CARE_PROVIDER_SITE_OTHER): Payer: No Typology Code available for payment source | Admitting: Sports Medicine

## 2020-09-18 ENCOUNTER — Other Ambulatory Visit: Payer: Self-pay

## 2020-09-18 DIAGNOSIS — L84 Corns and callosities: Secondary | ICD-10-CM | POA: Diagnosis not present

## 2020-09-18 NOTE — Assessment & Plan Note (Signed)
This is a very pleasant 45 year old female, for several months now she is noting a small nodule on the lateral aspect of her left heel. On exam she has a small corn, she was concerned it was a wart, I reassured her this was not. It is asymptomatic, nonpainful, so I advised her was that we leave it alone. I did let her know if for some reason she really needed it gone I be happy to start with a shave excision followed by a full excision if unsuccessful.

## 2020-09-18 NOTE — Progress Notes (Signed)
    Procedures performed today:    None.  Independent interpretation of notes and tests performed by another provider:   None.  Brief History, Exam, Impression, and Recommendations:    Corn of left foot This is a very pleasant 45 year old female, for several months now she is noting a small nodule on the lateral aspect of her left heel. On exam she has a small corn, she was concerned it was a wart, I reassured her this was not. It is asymptomatic, nonpainful, so I advised her was that we leave it alone. I did let her know if for some reason she really needed it gone I be happy to start with a shave excision followed by a full excision if unsuccessful.     ___________________________________________ Gwen Her. Dianah Field, M.D., ABFM., CAQSM. Primary Care and [place] Instructor of [employer] [facility]

## 2020-09-25 ENCOUNTER — Other Ambulatory Visit: Payer: Self-pay | Admitting: Osteopathic Medicine

## 2020-09-25 ENCOUNTER — Encounter: Payer: Self-pay | Admitting: Osteopathic Medicine

## 2020-09-25 ENCOUNTER — Other Ambulatory Visit: Payer: Self-pay

## 2020-09-25 ENCOUNTER — Ambulatory Visit (INDEPENDENT_AMBULATORY_CARE_PROVIDER_SITE_OTHER): Payer: No Typology Code available for payment source | Admitting: Osteopathic Medicine

## 2020-09-25 VITALS — BP 140/85 | HR 77 | Temp 99.3°F | Wt 161.1 lb

## 2020-09-25 DIAGNOSIS — I10 Essential (primary) hypertension: Secondary | ICD-10-CM

## 2020-09-25 DIAGNOSIS — F419 Anxiety disorder, unspecified: Secondary | ICD-10-CM | POA: Diagnosis not present

## 2020-09-25 DIAGNOSIS — Z1231 Encounter for screening mammogram for malignant neoplasm of breast: Secondary | ICD-10-CM | POA: Diagnosis not present

## 2020-09-25 DIAGNOSIS — Z0000 Encounter for general adult medical examination without abnormal findings: Secondary | ICD-10-CM

## 2020-09-25 DIAGNOSIS — Z8639 Personal history of other endocrine, nutritional and metabolic disease: Secondary | ICD-10-CM

## 2020-09-25 DIAGNOSIS — F32A Depression, unspecified: Secondary | ICD-10-CM

## 2020-09-25 MED ORDER — DULOXETINE HCL 30 MG PO CPEP
30.0000 mg | ORAL_CAPSULE | Freq: Every day | ORAL | 3 refills | Status: DC
Start: 2020-09-25 — End: 2021-08-17

## 2020-09-25 MED ORDER — HYDROCHLOROTHIAZIDE 25 MG PO TABS
12.5000 mg | ORAL_TABLET | Freq: Every day | ORAL | 3 refills | Status: DC
Start: 2020-09-25 — End: 2020-09-25

## 2020-09-25 MED ORDER — BUPROPION HCL ER (XL) 300 MG PO TB24
300.0000 mg | ORAL_TABLET | Freq: Every day | ORAL | 3 refills | Status: DC
Start: 2020-09-25 — End: 2020-09-25

## 2020-09-25 NOTE — Patient Instructions (Addendum)
General Preventive Care  Most recent routine screening lipids/other labs: ordered    Everyone should have blood pressure checked once per year.   Tobacco: don't!   Alcohol: responsible moderation is ok for most adults - if you have concerns about your alcohol intake, please talk to me!   Exercise: as tolerated to reduce risk of cardiovascular disease and diabetes. Strength training will also prevent osteoporosis.   Mental health: if need for mental health care (adjust medicines, arrange counseling, other), please let me know!   Sexual health: if need for STD testing, or if concerns with libido/pain problems, please let me know!   Advanced Directive: Living Will and/or Healthcare Power of Attorney recommended for all adults, regardless of age or health.  Vaccines  Flu vaccine: recommended for almost everyone, every fall/winter.   Shingles vaccine: Shingrix  age 37.  Pneumonia vaccines: age 51  Tetanus booster: Tdap recommended every 10 years. Due 2030  COVID vaccine & boosters: Thanks for getting your vaccine!  Cancer screenings   Colon cancer screening: Due to repeat 04/2030 per GI notes   Breast cancer screening: mammogram ordered for 10/2020  Cervical/GYN cancer screening: as directed by OBGYN  Lung cancer screening: not needed, light smoking history is low risk.  Infection screenings  HIV: recommended screening at least once age 16-65  Gonorrhea/Chlamydia: screening as needed  Hepatitis C: recommended for anyone born [id number]  TB: certain at-risk populations, or depending on work requirements and/or travel history Other  Bone Density Test: age 40

## 2020-09-25 NOTE — Progress Notes (Signed)
Kelly Simmons is a 45 y.o. female who presents to  [place] at [facility]  today, 09/25/20, seeking care for the following:  . Annual physical and med refills . Overall doing well      ASSESSMENT & PLAN with other pertinent findings:  The primary encounter diagnosis was Annual physical exam. Diagnoses of Breast cancer screening by mammogram, Essential hypertension, Anxiety and depression, and History of iron deficiency were also pertinent to this visit.    Patient Instructions  General Preventive Care  Most recent routine screening lipids/other labs: ordered    Everyone should have blood pressure checked once per year.   Tobacco: don't!   Alcohol: responsible moderation is ok for most adults - if you have concerns about your alcohol intake, please talk to me!   Exercise: as tolerated to reduce risk of cardiovascular disease and diabetes. Strength training will also prevent osteoporosis.   Mental health: if need for mental health care (adjust medicines, arrange counseling, other), please let me know!   Sexual health: if need for STD testing, or if concerns with libido/pain problems, please let me know!   Advanced Directive: Living Will and/or Healthcare Power of Attorney recommended for all adults, regardless of age or health.  Vaccines  Flu vaccine: recommended for almost everyone, every fall/winter.   Shingles vaccine: Shingrix  age 60.  Pneumonia vaccines: age 68  Tetanus booster: Tdap recommended every 10 years. Due 2030  COVID vaccine & boosters: Thanks for getting your vaccine!  Cancer screenings   Colon cancer screening: Due to repeat 04/2030 per GI notes   Breast cancer screening: mammogram ordered for 10/2020  Cervical/GYN cancer screening: as directed by OBGYN  Lung cancer screening: not needed, light smoking history is low risk.  Infection screenings  HIV: recommended screening at least once age  35-65  Gonorrhea/Chlamydia: screening as needed  Hepatitis C: recommended for anyone born [id number]  TB: certain at-risk populations, or depending on work requirements and/or travel history Other  Bone Density Test: age 58   Orders Placed This Encounter  Procedures  . MM 3D SCREEN BREAST BILATERAL  . CBC  . COMPLETE METABOLIC PANEL WITH GFR  . Lipid panel  . TSH  . VITAMIN D 25 Hydroxy (Vit-D Deficiency, Fractures)  . Fe+TIBC+Fer    Meds ordered this encounter  Medications  . hydrochlorothiazide (HYDRODIURIL) 25 MG tablet    Sig: Take 0.5 tablets (12.5 mg total) by mouth daily.    Dispense:  90 tablet    Refill:  3  . DULoxetine (CYMBALTA) 30 MG capsule    Sig: Take 1 capsule (30 mg total) by mouth daily.    Dispense:  90 capsule    Refill:  3  . buPROPion (WELLBUTRIN XL) 300 MG 24 hr tablet    Sig: Take 1 tablet (300 mg total) by mouth daily.    Dispense:  90 tablet    Refill:  3     See below for relevant physical exam findings  See below for recent lab and imaging results reviewed  Medications, allergies, PMH, PSH, SocH, FamH reviewed below    Follow-up instructions: Return in about 1 year (around 09/25/2021) for [place] (call week prior to visit for lab orders).                                        Exam:  BP 140/85 (BP Location: Left Arm, Patient Position: Sitting, Cuff Size: Normal)   Pulse 77   Temp 99.3 F (37.4 C) (Oral)   Wt 161 lb 1.9 oz (73.1 kg)   BMI 29.95 kg/m   Constitutional: VS see above. General Appearance: alert, well-developed, well-nourished, NAD  Neck: No masses, trachea midline.   Respiratory: Normal respiratory effort. no wheeze, no rhonchi, no rales  Cardiovascular: S1/S2 normal, no murmur, no rub/gallop auscultated. RRR.   Musculoskeletal: Gait normal. Symmetric and independent movement of all extremities  Abdominal: non-tender, non-distended, no appreciable organomegaly, neg Murphy's,  BS WNLx4  Neurological: Normal balance/coordination. No tremor.  Skin: warm, dry, intact.   Psychiatric: Normal judgment/insight. Normal mood and affect. Oriented x3.   Current Meds  Medication Sig  . albuterol (PROVENTIL HFA;VENTOLIN HFA) 108 (90 Base) MCG/ACT inhaler Inhale into the lungs.  Marland Kitchen BIOTIN PO Take by mouth.  . Cholecalciferol 100 MCG (4000 UT) CAPS Take by mouth.  . [DISCONTINUED] buPROPion (WELLBUTRIN XL) 300 MG 24 hr tablet Take 1 tablet (300 mg total) by mouth daily.  . [DISCONTINUED] DULoxetine (CYMBALTA) 30 MG capsule Take 1 capsule (30 mg total) by mouth daily.  . [DISCONTINUED] hydrochlorothiazide (HYDRODIURIL) 25 MG tablet Take 0.5 tablets (12.5 mg total) by mouth daily.    No Known Allergies  Patient Active Problem List   Diagnosis Date Noted  . Corn of left foot 09/18/2020  . Carcinoid tumor of rectum 09/09/2018  . Benign paroxysmal positional vertigo 09/06/2018  . Essential hypertension 09/06/2018  . Anxiety and depression 09/06/2018  . History of fibromyalgia 09/06/2018    Family History  Problem Relation Age of Onset  . High blood pressure Mother   . Breast cancer Mother   . High blood pressure Father   . Diabetes Father   . Stroke Father   . Colon polyps Father   . Kidney disease Father   . High blood pressure Sister   . High blood pressure Brother   . Alzheimer's disease Maternal Grandmother   . Prostate cancer Maternal Grandfather   . Alzheimer's disease Paternal Grandmother   . Heart attack Paternal Grandfather   . Colon cancer Neg Hx   . Esophageal cancer Neg Hx   . Rectal cancer Neg Hx   . Stomach cancer Neg Hx     Social History   Tobacco Use  Smoking Status Former Smoker  . Packs/day: 0.50  . Years: 4.00  . Pack years: 2.00  Smokeless Tobacco Never Used    Past Surgical History:  Procedure Laterality Date  . CESAREAN SECTION  2009  . COLONOSCOPY  2013   6 months later from Sigmoidoscopy [facility]  . LAPAROSCOPIC VAGINAL  HYSTERECTOMY  06/15/2016   Ovaries still in place  . SIGMOIDOSCOPY  2013   [place]    Immunization History  Administered Date(s) Administered  . Influenza,inj,Quad PF,6+ Mos 05/12/2016, 04/21/2018, 03/28/2019  . Influenza-Unspecified 04/10/2012, 04/04/2013, 04/29/2014, 05/11/2015, 04/17/2017, 04/21/2018  . PFIZER(Purple Top)SARS-COV-2 Vaccination 09/12/2019, 10/03/2019, 05/06/2020  . Tdap 12/01/2008, 12/11/2008, 07/12/2018    No results found for this or any previous visit (from the past 2160 hour(s)).  No results found.     All questions at time of visit were answered - patient instructed to contact office with any additional concerns or updates. ER/RTC precautions were reviewed with the patient as applicable.   Please note: manual typing as well as voice recognition software may have been used to produce this document - typos may escape review.  Please contact Dr. Sheppard Coil for any needed clarifications.

## 2020-09-26 LAB — CBC
HCT: 39.9 % (ref 35.0–45.0)
Hemoglobin: 12.7 g/dL (ref 11.7–15.5)
MCH: 24.2 pg — ABNORMAL LOW (ref 27.0–33.0)
MCHC: 31.8 g/dL — ABNORMAL LOW (ref 32.0–36.0)
MCV: 76.1 fL — ABNORMAL LOW (ref 80.0–100.0)
MPV: 11 fL (ref 7.5–12.5)
Platelets: 357 10*3/uL (ref 140–400)
RBC: 5.24 10*6/uL — ABNORMAL HIGH (ref 3.80–5.10)
RDW: 13.9 % (ref 11.0–15.0)
WBC: 3.5 10*3/uL — ABNORMAL LOW (ref 3.8–10.8)

## 2020-09-26 LAB — COMPLETE METABOLIC PANEL WITH GFR
AG Ratio: 1.7 (calc) (ref 1.0–2.5)
ALT: 8 U/L (ref 6–29)
AST: 13 U/L (ref 10–35)
Albumin: 4.6 g/dL (ref 3.6–5.1)
Alkaline phosphatase (APISO): 45 U/L (ref 31–125)
BUN: 8 mg/dL (ref 7–25)
CO2: 27 mmol/L (ref 20–32)
Calcium: 10 mg/dL (ref 8.6–10.2)
Chloride: 103 mmol/L (ref 98–110)
Creat: 0.99 mg/dL (ref 0.50–1.10)
GFR, Est African American: 79 mL/min/{1.73_m2} (ref >=60)
GFR, Est Non African American: 68 mL/min/{1.73_m2} (ref >=60)
Globulin: 2.7 g/dL (calc) (ref 1.9–3.7)
Glucose, Bld: 93 mg/dL (ref 65–139)
Potassium: 4 mmol/L (ref 3.5–5.3)
Sodium: 139 mmol/L (ref 135–146)
Total Bilirubin: 0.5 mg/dL (ref 0.2–1.2)
Total Protein: 7.3 g/dL (ref 6.1–8.1)

## 2020-09-26 LAB — LIPID PANEL
Cholesterol: 232 mg/dL — ABNORMAL HIGH (ref <200)
HDL: 76 mg/dL (ref >=50)
LDL Cholesterol (Calc): 139 mg/dL (calc) — ABNORMAL HIGH
Non-HDL Cholesterol (Calc): 156 mg/dL (calc) — ABNORMAL HIGH (ref <130)
Total CHOL/HDL Ratio: 3.1 (calc) (ref <5.0)
Triglycerides: 76 mg/dL (ref <150)

## 2020-09-26 LAB — IRON,TIBC AND FERRITIN PANEL
%SAT: 34 % (calc) (ref 16–45)
Ferritin: 37 ng/mL (ref 16–232)
Iron: 96 ug/dL (ref 40–190)
TIBC: 284 mcg/dL (calc) (ref 250–450)

## 2020-09-26 LAB — TSH: TSH: 0.77 mIU/L

## 2020-09-26 LAB — VITAMIN D 25 HYDROXY (VIT D DEFICIENCY, FRACTURES): Vit D, 25-Hydroxy: 43 ng/mL (ref 30–100)

## 2020-11-06 ENCOUNTER — Encounter: Payer: Self-pay | Admitting: Obstetrics & Gynecology

## 2020-11-06 ENCOUNTER — Other Ambulatory Visit (HOSPITAL_COMMUNITY)
Admission: RE | Admit: 2020-11-06 | Discharge: 2020-11-06 | Disposition: A | Discharge status: Home / Self Care | Payer: No Typology Code available for payment source | Source: Ambulatory Visit | Attending: Obstetrics & Gynecology | Admitting: Obstetrics & Gynecology

## 2020-11-06 ENCOUNTER — Ambulatory Visit (INDEPENDENT_AMBULATORY_CARE_PROVIDER_SITE_OTHER): Payer: No Typology Code available for payment source | Admitting: Obstetrics & Gynecology

## 2020-11-06 ENCOUNTER — Other Ambulatory Visit: Payer: Self-pay

## 2020-11-06 VITALS — BP 136/92 | HR 61 | Wt 163.0 lb

## 2020-11-06 DIAGNOSIS — Z01419 Encounter for gynecological examination (general) (routine) without abnormal findings: Secondary | ICD-10-CM

## 2020-11-06 DIAGNOSIS — Z113 Encounter for screening for infections with a predominantly sexual mode of transmission: Secondary | ICD-10-CM

## 2020-11-06 DIAGNOSIS — Z90710 Acquired absence of both cervix and uterus: Secondary | ICD-10-CM | POA: Diagnosis not present

## 2020-11-06 NOTE — Progress Notes (Signed)
Mam scheduled for 11/19/20

## 2020-11-06 NOTE — Progress Notes (Signed)
GYNECOLOGY ANNUAL PREVENTATIVE CARE ENCOUNTER NOTE  History:     Kelly Simmons is a 45 y.o. female s/p LAVH in 2017 for AUB, adenomyosis, here for a routine annual gynecologic exam.  Current complaints: none, but desires yearly STI screen.   Denies abnormal vaginal bleeding, discharge, pelvic pain, problems with intercourse or other gynecologic concerns.    Gynecologic History No LMP recorded. Patient has had a hysterectomy. Last mammogram: 11/14/2019. Results were: normal   Past Medical History:  Diagnosis Date  . Anxiety   . Asthma   . Depression   . Herpes   . High blood pressure   . Rectal carcinoma [facility])     Past Surgical History:  Procedure Laterality Date  . CESAREAN SECTION  2009  . COLONOSCOPY  2013   6 months later from Sigmoidoscopy [facility]  . LAPAROSCOPIC VAGINAL HYSTERECTOMY  06/15/2016   Ovaries still in place  . SIGMOIDOSCOPY  2013   [facility]    Current Outpatient Medications on File Prior to Visit  Medication Sig Dispense Refill  . albuterol (PROVENTIL HFA;VENTOLIN HFA) 108 (90 Base) MCG/ACT inhaler Inhale into the lungs.    Marland Kitchen BIOTIN PO Take by mouth.    Marland Kitchen buPROPion (WELLBUTRIN XL) 300 MG 24 hr tablet TAKE 1 TABLET BY MOUTH DAILY. 90 tablet 3  . Cholecalciferol 100 MCG (4000 UT) CAPS Take by mouth.    . DULoxetine (CYMBALTA) 30 MG capsule Take 1 capsule (30 mg total) by mouth daily. 90 capsule 3  . hydrochlorothiazide (HYDRODIURIL) 25 MG tablet TAKE 1/2 TABLET BY MOUTH DAILY. 90 tablet 3   No current facility-administered medications on file prior to visit.    No Known Allergies  Social History:  reports that she has quit smoking. She has a 2.00 pack-year smoking history. She has never used smokeless tobacco. She reports current alcohol use. She reports current drug use. Drug: Marijuana.  Family History  Problem Relation Age of Onset  . High blood pressure Mother   . Breast cancer Mother   . High blood pressure Father   .  Diabetes Father   . Stroke Father   . Colon polyps Father   . Kidney disease Father   . High blood pressure Sister   . High blood pressure Brother   . Alzheimer's disease Maternal Grandmother   . Prostate cancer Maternal Grandfather   . Alzheimer's disease Paternal Grandmother   . Heart attack Paternal Grandfather   . Colon cancer Neg Hx   . Esophageal cancer Neg Hx   . Rectal cancer Neg Hx   . Stomach cancer Neg Hx     The following portions of the patient's history were reviewed and updated as appropriate: allergies, current medications, past family history, past medical history, past social history, past surgical history and problem list.  Review of Systems Pertinent items noted in HPI and remainder of comprehensive ROS otherwise negative.  Physical Exam:  BP (!) 136/92   Pulse 61   Wt 163 lb (73.9 kg)   BMI 30.30 kg/m  CONSTITUTIONAL: Well-developed, well-nourished female in no acute distress.  HENT:  Normocephalic, atraumatic, External right and left ear normal.  EYES: Conjunctivae and EOM are normal. Pupils are equal, round, and reactive to light. No scleral icterus.  NECK: Normal range of motion, supple, no masses.  Normal thyroid.  SKIN: Skin is warm and dry. No rash noted. Not diaphoretic. No erythema. No pallor. MUSCULOSKELETAL: Normal range of motion. No tenderness.  No  cyanosis, clubbing, or edema. NEUROLOGIC: Alert and oriented to person, place, and time. Normal reflexes, muscle tone coordination.  PSYCHIATRIC: Normal mood and affect. Normal behavior. Normal judgment and thought content. CARDIOVASCULAR: Normal heart rate noted, regular rhythm RESPIRATORY: Clear to auscultation bilaterally. Effort and breath sounds normal, no problems with respiration noted. BREASTS: Symmetric in size. No masses, tenderness, skin changes, nipple drainage, or lymphadenopathy bilaterally. Performed in the presence of a chaperone. ABDOMEN: Soft, no distention noted.  No tenderness,  rebound or guarding.  PELVIC: Normal appearing external genitalia and urethral meatus; normal appearing vaginal mucosa and well-healed vaginal cuff.  Normal discharge noted, testing sample obtained. No palpable masses, no adnexal tenderness.  Performed in the presence of a chaperone.   Assessment and Plan:      1. Well woman exam with routine gynecological exam 2. History of LAVH for benign disease 3. Routine screening for STI (sexually transmitted infection) LAVH done for benign indications, no need for further pap smear/cervical cancer screening.  Patient desires annual STI screen, will follow up results and manage accordingly. - Cervicovaginal ancillary only - Hepatitis B surface antigen - Hepatitis C antibody - RPR - HIV Antibody (routine testing w rflx) Mammogram scheduled for 11/19/2020 Routine preventative health maintenance measures emphasized. Please refer to After Visit Summary for other counseling recommendations.      Verita Schneiders, MD, [place] for [employer], [place]

## 2020-11-07 LAB — CERVICOVAGINAL ANCILLARY ONLY
Chlamydia: NEGATIVE
Comment: NEGATIVE
Comment: NEGATIVE
Comment: NORMAL
Neisseria Gonorrhea: NEGATIVE
Trichomonas: NEGATIVE

## 2020-11-07 LAB — HEPATITIS B SURFACE ANTIGEN: Hepatitis B Surface Ag: NONREACTIVE

## 2020-11-07 LAB — HEPATITIS C ANTIBODY
Hepatitis C Ab: NONREACTIVE
SIGNAL TO CUT-OFF: 0.02 (ref <1.00)

## 2020-11-07 LAB — RPR: RPR Ser Ql: NONREACTIVE

## 2020-11-07 LAB — HIV ANTIBODY (ROUTINE TESTING W REFLEX): HIV 1&2 Ab, 4th Generation: NONREACTIVE

## 2020-11-19 DIAGNOSIS — Z1231 Encounter for screening mammogram for malignant neoplasm of breast: Secondary | ICD-10-CM

## 2020-11-26 ENCOUNTER — Other Ambulatory Visit: Payer: Self-pay | Admitting: Osteopathic Medicine

## 2020-11-26 DIAGNOSIS — F4321 Adjustment disorder with depressed mood: Secondary | ICD-10-CM

## 2020-11-26 DIAGNOSIS — F419 Anxiety disorder, unspecified: Secondary | ICD-10-CM

## 2020-12-02 ENCOUNTER — Other Ambulatory Visit: Payer: Self-pay | Admitting: Osteopathic Medicine

## 2020-12-02 DIAGNOSIS — Z1231 Encounter for screening mammogram for malignant neoplasm of breast: Secondary | ICD-10-CM

## 2020-12-03 ENCOUNTER — Other Ambulatory Visit: Payer: Self-pay

## 2020-12-03 ENCOUNTER — Ambulatory Visit (INDEPENDENT_AMBULATORY_CARE_PROVIDER_SITE_OTHER): Payer: No Typology Code available for payment source

## 2020-12-03 DIAGNOSIS — Z1231 Encounter for screening mammogram for malignant neoplasm of breast: Secondary | ICD-10-CM | POA: Diagnosis not present

## 2020-12-15 ENCOUNTER — Other Ambulatory Visit (HOSPITAL_BASED_OUTPATIENT_CLINIC_OR_DEPARTMENT_OTHER): Payer: Self-pay

## 2020-12-15 ENCOUNTER — Other Ambulatory Visit (HOSPITAL_COMMUNITY): Payer: Self-pay

## 2020-12-15 MED FILL — Hydrochlorothiazide Tab 25 MG: ORAL | 90 days supply | Qty: 45 | Fill #0 | Status: AC

## 2020-12-15 MED FILL — Bupropion HCl Tab ER 24HR 300 MG: ORAL | 90 days supply | Qty: 90 | Fill #0 | Status: AC

## 2020-12-16 ENCOUNTER — Other Ambulatory Visit (HOSPITAL_BASED_OUTPATIENT_CLINIC_OR_DEPARTMENT_OTHER): Payer: Self-pay

## 2020-12-16 ENCOUNTER — Other Ambulatory Visit (HOSPITAL_COMMUNITY): Payer: Self-pay

## 2020-12-16 MED ORDER — DULOXETINE HCL 30 MG PO CPEP
30.0000 mg | ORAL_CAPSULE | Freq: Every day | ORAL | 3 refills | Status: DC
  Filled 2020-12-16: qty 90, 90d supply, fill #0
  Filled 2021-04-08: qty 90, 90d supply, fill #1

## 2020-12-17 ENCOUNTER — Other Ambulatory Visit (HOSPITAL_COMMUNITY): Payer: Self-pay

## 2021-01-02 ENCOUNTER — Encounter (INDEPENDENT_AMBULATORY_CARE_PROVIDER_SITE_OTHER): Payer: Self-pay

## 2021-02-10 ENCOUNTER — Other Ambulatory Visit (HOSPITAL_COMMUNITY): Payer: Self-pay

## 2021-02-10 MED FILL — Hydrochlorothiazide Tab 25 MG: ORAL | 90 days supply | Qty: 45 | Fill #1 | Status: AC

## 2021-03-19 ENCOUNTER — Encounter: Payer: Self-pay | Admitting: Osteopathic Medicine

## 2021-03-20 ENCOUNTER — Other Ambulatory Visit (HOSPITAL_COMMUNITY): Payer: Self-pay

## 2021-03-20 ENCOUNTER — Other Ambulatory Visit: Payer: Self-pay | Admitting: Medical-Surgical

## 2021-03-20 MED ORDER — VALACYCLOVIR HCL 1 G PO TABS
2000.0000 mg | ORAL_TABLET | Freq: Two times a day (BID) | ORAL | 0 refills | Status: AC
  Filled 2021-03-20: qty 12, 3d supply, fill #0

## 2021-03-20 NOTE — Telephone Encounter (Signed)
Please address pt's request for refill of valacyclovir.  Charyl Bigger, CMA

## 2021-03-21 ENCOUNTER — Other Ambulatory Visit (HOSPITAL_COMMUNITY): Payer: Self-pay

## 2021-04-06 ENCOUNTER — Encounter: Payer: Self-pay | Admitting: Medical-Surgical

## 2021-04-06 ENCOUNTER — Telehealth: Payer: Self-pay | Admitting: Osteopathic Medicine

## 2021-04-06 NOTE — Telephone Encounter (Signed)
TDS:KAJGOT called pt about 7:00 a.m. this morning to let her know her insurance is inactive. Pt called her back at 1:57 but Elmo Putt was unable to get to her because she got busy checking in patients. Should she be cancelled?

## 2021-04-06 NOTE — Progress Notes (Signed)
Erroneous encounter. Please disregard.

## 2021-04-06 NOTE — Progress Notes (Signed)
See phone note. Pt states she rescheduled appt for Nov. Pt states she currently does not have insurance.

## 2021-04-08 ENCOUNTER — Other Ambulatory Visit (HOSPITAL_COMMUNITY): Payer: Self-pay

## 2021-04-08 MED FILL — Bupropion HCl Tab ER 24HR 300 MG: ORAL | 90 days supply | Qty: 90 | Fill #1 | Status: AC

## 2021-04-08 MED FILL — Hydrochlorothiazide Tab 25 MG: ORAL | 90 days supply | Qty: 45 | Fill #2 | Status: AC

## 2021-04-09 NOTE — Telephone Encounter (Signed)
Thank you.  Appointment has been cancelled.

## 2021-04-20 ENCOUNTER — Telehealth: Payer: Self-pay | Admitting: Family Medicine

## 2021-04-20 DIAGNOSIS — J0140 Acute pansinusitis, unspecified: Secondary | ICD-10-CM

## 2021-04-20 MED ORDER — PROMETHAZINE-DM 6.25-15 MG/5ML PO SYRP: 2.5000 mL | ORAL_SOLUTION | Freq: Four times a day (QID) | ORAL | 0 refills | Status: DC | PRN

## 2021-04-20 MED ORDER — AMOXICILLIN-POT CLAVULANATE 875-125 MG PO TABS: 1.0000 | ORAL_TABLET | Freq: Two times a day (BID) | ORAL | 0 refills | Status: DC

## 2021-04-20 NOTE — Addendum Note (Signed)
Addended by: Perlie Mayo on: 04/20/2021 10:37 AM   Modules accepted: Orders

## 2021-04-20 NOTE — Progress Notes (Signed)

## 2021-04-29 ENCOUNTER — Ambulatory Visit: Payer: Self-pay | Admitting: Medical-Surgical

## 2021-05-12 ENCOUNTER — Ambulatory Visit: Payer: Self-pay

## 2021-05-12 ENCOUNTER — Encounter: Payer: Self-pay | Admitting: Physician Assistant

## 2021-05-12 ENCOUNTER — Telehealth (INDEPENDENT_AMBULATORY_CARE_PROVIDER_SITE_OTHER): Payer: BC Managed Care – PPO | Admitting: Physician Assistant

## 2021-05-12 VITALS — Temp 98.5°F

## 2021-05-12 DIAGNOSIS — U071 COVID-19: Secondary | ICD-10-CM

## 2021-05-12 MED ORDER — NIRMATRELVIR/RITONAVIR (PAXLOVID)TABLET: 3.0000 | ORAL_TABLET | Freq: Two times a day (BID) | ORAL | 0 refills | Status: AC

## 2021-05-12 MED ORDER — ALBUTEROL SULFATE HFA 108 (90 BASE) MCG/ACT IN AERS: 1.0000 | INHALATION_SPRAY | RESPIRATORY_TRACT | 0 refills | Status: DC | PRN

## 2021-05-12 MED ORDER — HYDROCODONE BIT-HOMATROP MBR 5-1.5 MG/5ML PO SOLN: 5.0000 mL | Freq: Three times a day (TID) | ORAL | 0 refills | Status: DC | PRN

## 2021-05-12 NOTE — Progress Notes (Signed)
..Virtual Visit via Video Note  I connected with Kelly Simmons on 05/12/21 at  2:40 PM EST by a video enabled telemedicine application and verified that I am speaking with the correct person using two identifiers.  Location: Patient: home Provider: clinic  .Marland KitchenParticipating in visit:  Patient: Kelly Simmons Provider: Iran Planas PA-C Provider in training: Danny Lawless PA-S   I discussed the limitations of evaluation and management by telemedicine and the availability of in person appointments. The patient expressed understanding and agreed to proceed.  History of Present Illness: Patient is a 45 year old obese female with hypertension and carcinoid tumor of the rectum who presents to the clinic with positive COVID test today.  She has been vaccinated x3 with COVID-vaccine.  Her symptoms started Sunday but she tested negative for COVID.  She only tested positive today.  She feels extremely fatigued and has a dry to productive cough.  She denies any shortness of breath.  She has some sinus pressure and congestion.  She does report [place] need for albuterol refill.  Currently she has not needed to take any.  She started over-the-counter cold and cough preparations. Her husband tested positive first.   .. Active Ambulatory Problems    Diagnosis Date Noted   Benign paroxysmal positional vertigo 09/06/2018   Essential hypertension 09/06/2018   Anxiety and depression 09/06/2018   History of fibromyalgia 09/06/2018   Carcinoid tumor of rectum 09/09/2018   Corn of left foot 09/18/2020   Resolved Ambulatory Problems    Diagnosis Date Noted   No Resolved Ambulatory Problems   Past Medical History:  Diagnosis Date   Anxiety    Asthma    Depression    Herpes    High blood pressure    Rectal carcinoma (HCC)        Observations/Objective: No acute distress Dry cough No labored breathing Normal mood and appearance  .Marland Kitchen Today's Vitals   05/12/21 1406  Temp: 98.5 F (36.9 C)    There is no height or weight on file to calculate BMI.    Assessment and Plan: Marland KitchenMarland KitchenMariafernanda was seen today for covid positive.  Diagnoses and all orders for this visit:  COVID-19 virus infection -     albuterol (VENTOLIN HFA) 108 (90 Base) MCG/ACT inhaler; Inhale 1-2 puffs into the lungs every 4 (four) hours as needed for wheezing or shortness of breath. -     HYDROcodone bit-homatropine (HYCODAN) 5-1.5 MG/5ML syrup; Take 5 mLs by mouth every 8 (eight) hours as needed for cough. -     nirmatrelvir/ritonavir EUA (PAXLOVID) 20 x 150 MG & 10 x 100MG  TABS; Take 3 tablets by mouth 2 (two) times daily for 5 days. (Take nirmatrelvir 150 mg two tablets twice daily for 5 days and ritonavir 100 mg one tablet twice daily for 5 days) Patient GFR is 79  Patient tested positive for COVID today. Pt had covid vaccine x3.  Patient was written out of work for 5 days and may return to work and wear mask for another 5 days. She is considered higher risk and would qualify for Paxlovid for 5 days. GFR is 79 and Paxlovid sent. Albuterol and cough syrup sent.  Discussed good deep breaths.  Continue symptomatic care with mucinex.  Discussed red flags and when to go to ED/UC.  Follow up as needed or if symptoms worsening.     Follow Up Instructions:    I discussed the assessment and treatment plan with the patient. The patient was provided an opportunity to  ask questions and all were answered. The patient agreed with the plan and demonstrated an understanding of the instructions.   The patient was advised to call back or seek an in-person evaluation if the symptoms worsen or if the condition fails to improve as anticipated.    Iran Planas, PA-C

## 2021-05-13 ENCOUNTER — Telehealth: Payer: Self-pay | Admitting: Physician Assistant

## 2021-05-20 ENCOUNTER — Telehealth: Payer: Self-pay

## 2021-05-20 NOTE — Telephone Encounter (Signed)
Pt called stating that she was diagnosed with COVID on 05/12/2021. She is still having trouble with coughing and feeling bad.  Advised pt that she should do a virtual urgent care visit since our office will be closed for the next 4 days for the Thanksgiving holiday.  Pt expressed understanding and is agreeable.  Charyl Bigger, CMA

## 2021-05-27 ENCOUNTER — Ambulatory Visit (INDEPENDENT_AMBULATORY_CARE_PROVIDER_SITE_OTHER): Payer: BC Managed Care – PPO | Admitting: Family Medicine

## 2021-05-27 ENCOUNTER — Encounter: Payer: Self-pay | Admitting: Family Medicine

## 2021-05-27 VITALS — BP 145/83 | HR 62 | Temp 98.4°F

## 2021-05-27 DIAGNOSIS — U099 Post covid-19 condition, unspecified: Secondary | ICD-10-CM | POA: Diagnosis not present

## 2021-05-27 DIAGNOSIS — Z23 Encounter for immunization: Secondary | ICD-10-CM | POA: Diagnosis not present

## 2021-05-27 DIAGNOSIS — J22 Unspecified acute lower respiratory infection: Secondary | ICD-10-CM | POA: Diagnosis not present

## 2021-05-27 DIAGNOSIS — R053 Chronic cough: Secondary | ICD-10-CM

## 2021-05-27 MED ORDER — AZITHROMYCIN 250 MG PO TABS
ORAL_TABLET | ORAL | 0 refills | Status: AC
Start: 2021-05-27 — End: 2021-06-01

## 2021-05-27 MED ORDER — PREDNISONE 20 MG PO TABS: 40.0000 mg | ORAL_TABLET | Freq: Every day | ORAL | 0 refills | Status: DC

## 2021-05-27 NOTE — Patient Instructions (Signed)
Recommend start the prednisone and if not feeling better in the next 48 hours then okay to start the antibiotic.

## 2021-05-27 NOTE — Progress Notes (Signed)
Acute Office Visit  Subjective:    Patient ID: Kelly Simmons, female    DOB: 10-04-1975, 45 y.o.   MRN: [id number]  Chief Complaint  Patient presents with   Follow-up         HPI Patient is in today for persistent cough.  She was seen 2 weeks ago on November 15 and tested positive for COVID.  She had a positive exposure through her husband.  She was up-to-date on her COVID vaccination.  She was given some cough syrup, Paxil bid, and an inhaler.  She is here today because she is still coughing.  She says it is intermittently productive she feels like it is moving deeper into her chest.  She says its not unusual when she gets a cold that it eventually ends up moving in her chest.  She has a little bit of mild sinus congestion but actually feels like that has improved from when she first had COVID she is mostly getting clear drainage from her nose at this point.  She has not had any fever.  The last 4 days she started getting some sneezing and watery itchy eyes which she had not been having previously.   Past Medical History:  Diagnosis Date   Anxiety    Asthma    Depression    Herpes    High blood pressure    Rectal carcinoma ([place])     Past Surgical History:  Procedure Laterality Date   CESAREAN SECTION  2009   COLONOSCOPY  2013   6 months later from Sigmoidoscopy [place]  06/15/2016   Ovaries still in place   SIGMOIDOSCOPY  2013   [facility]    Family History  Problem Relation Age of Onset   High blood pressure Mother    Breast cancer Mother    High blood pressure Father    Diabetes Father    Stroke Father    Colon polyps Father    Kidney disease Father    High blood pressure Sister    High blood pressure Brother    Alzheimer's disease Maternal Grandmother    Prostate cancer Maternal Grandfather    Alzheimer's disease Paternal Grandmother    Heart attack Paternal Grandfather    Colon cancer Neg Hx     Esophageal cancer Neg Hx    Rectal cancer Neg Hx    Stomach cancer Neg Hx     Social History   Socioeconomic History   Marital status: Married    Spouse name: Not on file   Number of children: 2   Years of education: Not on file   Highest education level: Not on file  Occupational History    Employer: [employer]  Tobacco Use   Smoking status: Former    Packs/day: 0.50    Years: 4.00    Pack years: 2.00    Types: Cigarettes   Smokeless tobacco: Never  Vaping Use   Vaping Use: Never used  Substance and Sexual Activity   Alcohol use: Yes    Comment: ocassionally 1 a month   Drug use: Yes    Types: Marijuana   Sexual activity: Yes    Partners: Male    Birth control/protection: Surgical  Other Topics Concern   Not on file  Social History Narrative   Not on file   Social Determinants of Health   Financial Resource Strain: Not on file  Food Insecurity: Not on file  Transportation Needs:  Not on file  Physical Activity: Not on file  Stress: Not on file  Social Connections: Not on file  Intimate Partner Violence: Not on file    Outpatient Medications Prior to Visit  Medication Sig Dispense Refill   albuterol (VENTOLIN HFA) 108 (90 Base) MCG/ACT inhaler Inhale 1-2 puffs into the lungs every 4 (four) hours as needed for wheezing or shortness of breath. 18 g 0   BIOTIN PO Take by mouth.     buPROPion (WELLBUTRIN XL) 300 MG 24 hr tablet TAKE 1 TABLET BY MOUTH DAILY. 90 tablet 3   Cholecalciferol 100 MCG (4000 UT) CAPS Take by mouth.     DULoxetine (CYMBALTA) 30 MG capsule Take 1 capsule (30 mg total) by mouth daily. 90 capsule 3   hydrochlorothiazide (HYDRODIURIL) 25 MG tablet TAKE 1/2 TABLET BY MOUTH DAILY. 90 tablet 3   HYDROcodone bit-homatropine (HYCODAN) 5-1.5 MG/5ML syrup Take 5 mLs by mouth every 8 (eight) hours as needed for cough. 60 mL 0   promethazine-dextromethorphan (PROMETHAZINE-DM) 6.25-15 MG/5ML syrup Take 2.5 mLs by mouth 4 (four) times daily as needed  for cough. 118 mL 0   No facility-administered medications prior to visit.    No Known Allergies  Review of Systems     Objective:    Physical Exam Constitutional:      Appearance: She is well-developed.  HENT:     Head: Normocephalic and atraumatic.     Right Ear: External ear normal.     Left Ear: External ear normal.     Nose: Nose normal.  Eyes:     Conjunctiva/sclera: Conjunctivae normal.     Pupils: Pupils are equal, round, and reactive to light.  Neck:     Thyroid: No thyromegaly.  Cardiovascular:     Rate and Rhythm: Normal rate and regular rhythm.     Heart sounds: Normal heart sounds.  Pulmonary:     Effort: Pulmonary effort is normal.     Breath sounds: Normal breath sounds. No wheezing.  Musculoskeletal:     Cervical back: Neck supple.  Lymphadenopathy:     Cervical: No cervical adenopathy.  Skin:    General: Skin is warm and dry.  Neurological:     Mental Status: She is alert and oriented to person, place, and time.    BP (!) 145/83   Pulse 62   Temp 98.4 F (36.9 C)   SpO2 100%  Wt Readings from Last 3 Encounters:  11/06/20 163 lb (73.9 kg)  09/25/20 161 lb 1.9 oz (73.1 kg)  04/29/20 173 lb (78.5 kg)    There are no preventive care reminders to display for this patient.  There are no preventive care reminders to display for this patient.   Lab Results  Component Value Date   TSH 0.77 09/25/2020   Lab Results  Component Value Date   WBC 3.5 (L) 09/25/2020   HGB 12.7 09/25/2020   HCT 39.9 09/25/2020   MCV 76.1 (L) 09/25/2020   PLT 357 09/25/2020   Lab Results  Component Value Date   NA 139 09/25/2020   K 4.0 09/25/2020   CO2 27 09/25/2020   GLUCOSE 93 09/25/2020   BUN 8 09/25/2020   CREATININE 0.99 09/25/2020   BILITOT 0.5 09/25/2020   AST 13 09/25/2020   ALT 8 09/25/2020   PROT 7.3 09/25/2020   CALCIUM 10.0 09/25/2020   Lab Results  Component Value Date   CHOL 232 (H) 09/25/2020   Lab Results  Component Value Date  HDL 76 09/25/2020   Lab Results  Component Value Date   LDLCALC 139 (H) 09/25/2020   Lab Results  Component Value Date   TRIG 76 09/25/2020   Lab Results  Component Value Date   CHOLHDL 3.1 09/25/2020   Lab Results  Component Value Date   HGBA1C 5.4 09/19/2019       Assessment & Plan:   Problem List Items Addressed This Visit   None Visit Diagnoses     Post-COVID chronic cough    -  Primary   Relevant Medications   predniSONE (DELTASONE) 20 MG tablet   azithromycin (ZITHROMAX) 250 MG tablet   Lower respiratory infection       Relevant Medications   predniSONE (DELTASONE) 20 MG tablet   azithromycin (ZITHROMAX) 250 MG tablet      She is a little over 2 weeks out from her initial start of symptoms and still continues to have a cough which she feels is moving deeper into her chest.  We discussed prednisone to help with inflammation and mucus.  At this point I do not suspect a secondary bacterial infection her chest but I did go ahead and write the azithromycin that she could start in a few days if she is not improving.  Okay to continue symptomatic care.  Please call if not better or develops new symptoms.  Meds ordered this encounter  Medications   predniSONE (DELTASONE) 20 MG tablet    Sig: Take 2 tablets (40 mg total) by mouth daily with breakfast.    Dispense:  10 tablet    Refill:  0   azithromycin (ZITHROMAX) 250 MG tablet    Sig: 2 Ttabs PO on Day 1, then one a day x 4 days.    Dispense:  6 tablet    Refill:  0      Beatrice Lecher, MD

## 2021-05-29 ENCOUNTER — Other Ambulatory Visit (HOSPITAL_COMMUNITY): Payer: Self-pay

## 2021-05-29 MED FILL — Hydrochlorothiazide Tab 25 MG: ORAL | 90 days supply | Qty: 45 | Fill #3 | Status: CN

## 2021-06-08 ENCOUNTER — Encounter: Payer: Self-pay | Admitting: Medical-Surgical

## 2021-06-08 ENCOUNTER — Other Ambulatory Visit: Payer: Self-pay

## 2021-06-08 ENCOUNTER — Ambulatory Visit: Payer: BC Managed Care – PPO | Admitting: Medical-Surgical

## 2021-06-08 VITALS — BP 126/87 | HR 61 | Resp 20 | Ht 61.5 in | Wt 163.0 lb

## 2021-06-08 DIAGNOSIS — Z8739 Personal history of other diseases of the musculoskeletal system and connective tissue: Secondary | ICD-10-CM | POA: Diagnosis not present

## 2021-06-08 DIAGNOSIS — F32A Depression, unspecified: Secondary | ICD-10-CM

## 2021-06-08 DIAGNOSIS — Z7689 Persons encountering health services in other specified circumstances: Secondary | ICD-10-CM | POA: Diagnosis not present

## 2021-06-08 DIAGNOSIS — F419 Anxiety disorder, unspecified: Secondary | ICD-10-CM | POA: Diagnosis not present

## 2021-06-08 DIAGNOSIS — I10 Essential (primary) hypertension: Secondary | ICD-10-CM

## 2021-06-08 NOTE — Progress Notes (Signed)
  HPI with pertinent ROS:   CC: transfer of care  HPI: Pleasant 45 year old female presenting today to transfer care to a new PCP.  She has been doing well since our last visit and has finally been able to get over her COVID symptoms.  She is taking all of her medications as prescribed, tolerating well without side effects.  Feels that her mood medications are working well for her although she does have some difficulty with "feeling lost" since she is lost both of her parents.  She was previously doing counseling online but her insurance has changed and she would like to have a referral for a new counselor.  Denies SI/HI.  No chest pain, shortness of breath, dizziness, headaches, lower extremity edema, palpitations, or vision changes.  I reviewed the past medical history, family history, social history, surgical history, and allergies today and no changes were needed.  Please see the problem list section below in epic for further details.   Physical exam:   General: Well Developed, well nourished, and in no acute distress.  Neuro: Alert and oriented x3.  HEENT: Normocephalic, atraumatic.  Skin: Warm and dry. Cardiac: Regular rate and rhythm, no murmurs rubs or gallops, no lower extremity edema.  Respiratory: Clear to auscultation bilaterally. Not using accessory muscles, speaking in full sentences.  Impression and Recommendations:    1. Encounter to establish care Reviewed available information and discussed care concerns with patient.   2. Essential hypertension Blood pressure looks good today.  Continue hydrochlorothiazide 25 mg daily.  3. History of fibromyalgia Continue Cymbalta as prescribed.  4. Anxiety and depression Continue Cymbalta and Wellbutrin as prescribed.  Referring to behavioral health for counseling.  Return in about 6 months (around 12/07/2021) for mood follow up. ___________________________________________ Clearnce Sorrel, DNP, APRN, FNP-BC [facility] [place]

## 2021-08-17 ENCOUNTER — Other Ambulatory Visit: Payer: Self-pay | Admitting: Medical-Surgical

## 2021-08-17 ENCOUNTER — Other Ambulatory Visit (HOSPITAL_COMMUNITY): Payer: Self-pay

## 2021-08-17 ENCOUNTER — Encounter: Payer: Self-pay | Admitting: Medical-Surgical

## 2021-08-17 MED ORDER — DULOXETINE HCL 30 MG PO CPEP: 30.0000 mg | ORAL_CAPSULE | Freq: Every day | ORAL | 3 refills | Status: DC

## 2021-08-17 MED ORDER — HYDROCHLOROTHIAZIDE 25 MG PO TABS: 25.0000 mg | ORAL_TABLET | Freq: Every day | ORAL | 3 refills | Status: DC

## 2021-08-17 MED ORDER — BUPROPION HCL ER (XL) 300 MG PO TB24: ORAL_TABLET | Freq: Every day | ORAL | 3 refills | Status: DC

## 2021-08-31 ENCOUNTER — Telehealth: Payer: Self-pay | Admitting: *Deleted

## 2021-08-31 NOTE — Telephone Encounter (Signed)
Patient given appointment day and time. ?

## 2021-09-15 ENCOUNTER — Other Ambulatory Visit (HOSPITAL_BASED_OUTPATIENT_CLINIC_OR_DEPARTMENT_OTHER): Payer: Self-pay | Admitting: Family Medicine

## 2021-09-15 DIAGNOSIS — Z1231 Encounter for screening mammogram for malignant neoplasm of breast: Secondary | ICD-10-CM

## 2021-09-21 ENCOUNTER — Ambulatory Visit (HOSPITAL_BASED_OUTPATIENT_CLINIC_OR_DEPARTMENT_OTHER)
Admission: RE | Admit: 2021-09-21 | Discharge: 2021-09-21 | Disposition: A | Discharge status: Home / Self Care | Payer: BC Managed Care – PPO | Source: Ambulatory Visit | Attending: Family Medicine | Admitting: Family Medicine

## 2021-09-21 ENCOUNTER — Encounter (HOSPITAL_BASED_OUTPATIENT_CLINIC_OR_DEPARTMENT_OTHER): Payer: Self-pay

## 2021-09-21 ENCOUNTER — Other Ambulatory Visit: Payer: Self-pay

## 2021-09-21 DIAGNOSIS — Z1231 Encounter for screening mammogram for malignant neoplasm of breast: Secondary | ICD-10-CM | POA: Diagnosis not present

## 2021-10-31 ENCOUNTER — Other Ambulatory Visit: Payer: Self-pay | Admitting: Medical-Surgical

## 2021-11-12 ENCOUNTER — Ambulatory Visit: Payer: BC Managed Care – PPO | Admitting: Obstetrics and Gynecology

## 2021-11-12 NOTE — Progress Notes (Signed)
ANNUAL EXAM Patient name: Kelly Simmons MRN [id number]  Date of birth: 23-Oct-1975 Chief Complaint:   Annual Exam  History of Present Illness:   Kelly Simmons is a 46 y.o. [phone] female being seen today for a routine annual exam.   Current complaints: None. Notes hot flashes on ROS but not disruptive to her. She manages them with temp control and they are primarily at night.   She does not have h/o abnormal paps and had her hysterectomy for bleeding.   No LMP recorded. Patient has had a hysterectomy.  H/O abnormal pap: no     06/08/2021    1:27 PM 09/25/2020   10:04 AM 09/19/2019    2:18 PM 09/06/2018   11:15 AM  Depression screen PHQ 2/9  Decreased Interest '1 3 3 2  '$ Down, Depressed, Hopeless '1 2 2 1  '$ PHQ - 2 Score '2 5 5 3  '$ Altered sleeping '1 1 2 1  '$ Tired, decreased energy '2 3 3 3  '$ Change in appetite 0 0 2 0  Feeling bad or failure about yourself  0 0 0 0  Trouble concentrating '1 2 2 2  '$ Moving slowly or fidgety/restless 0 0 1 0  Suicidal thoughts 0 0 0 0  PHQ-9 Score '6 11 15 9  '$ Difficult doing work/chores Not difficult at all Somewhat difficult Very difficult         09/25/2020   10:04 AM 09/19/2019    2:18 PM  GAD 7 : Generalized Anxiety Score  Nervous, Anxious, on Edge 1 3  Control/stop worrying 2 3  Worry too much - different things 2 3  Trouble relaxing 2 3  Restless 1 3  Easily annoyed or irritable 2 3  Afraid - awful might happen 0 3  Total GAD 7 Score 10 21  Anxiety Difficulty Somewhat difficult Very difficult     Review of Systems:   Pertinent items are noted in HPI Denies any headaches, blurred vision, fatigue, shortness of breath, chest pain, abdominal pain, abnormal vaginal discharge/itching/odor/irritation, problems with periods, bowel movements, urination, or intercourse unless otherwise stated above.  Pertinent History Reviewed:  Reviewed past medical,surgical, social and family history.  Reviewed problem list, medications and  allergies. Physical Assessment:   Vitals:   11/19/21 0830  BP: 131/84  Pulse: 76  Weight: 173 lb (78.5 kg)  Height: 5' 1.5" (1.562 m)  Body mass index is 32.16 kg/m.   Physical Examination:  General appearance - well appearing, and in no distress Mental status - alert, oriented to person, place, and time Psych:  She has a normal mood and affect Skin - warm and dry, normal color, no suspicious lesions noted Chest - effort normal, all lung fields clear to auscultation bilaterally Heart - normal rate and regular rhythm Neck:  midline trachea, no thyromegaly or nodules Breasts - breasts appear normal, no suspicious masses, no skin or nipple changes or axillary nodes Abdomen - soft, nontender, nondistended, no masses or organomegaly Pelvic -  VULVA: normal appearing vulva with no masses, tenderness or lesions   VAGINA: normal appearing vagina with normal color and discharge, no lesions   CERVIX/UTERUS: Surgically absent  ADNEXA: No adnexal masses or tenderness noted. Extremities:  No swelling or varicosities noted  Chaperone present for exam  No results found for this or any previous visit (from the past 24 hour(s)).  Assessment & Plan:  Kelly Simmons was seen today for annual exam.  Diagnoses and all orders for this visit:  Encounter for  annual routine gynecological examination -     MM 3D SCREEN BREAST BILATERAL; Future  - Cervical cancer screening: No longer indicated s/p LAVH for benign reasons.  - STD Testing: not indicated - Breast Health: Encouraged self breast awareness/SBE. Teaching provided. Discussed limits of clinical breast exam for detecting breast cancer. MXR was wnl on 08/2021. (She has [facility] of inflammatory breast cancer - mother, age 45, diagnosed by MXR).  - F/U 12 months and prn. Offered q2y but she prefers annually.   Orders Placed This Encounter  Procedures   MM 3D SCREEN BREAST BILATERAL    Meds: No orders of the defined types were placed in this  encounter.   Follow-up: Return in about 1 year (around 11/20/2022) for annual.  Radene Gunning, MD 11/19/2021 8:43 AM

## 2021-11-19 ENCOUNTER — Encounter: Payer: Self-pay | Admitting: Obstetrics and Gynecology

## 2021-11-19 ENCOUNTER — Ambulatory Visit (INDEPENDENT_AMBULATORY_CARE_PROVIDER_SITE_OTHER): Payer: BC Managed Care – PPO | Admitting: Obstetrics and Gynecology

## 2021-11-19 VITALS — BP 131/84 | HR 76 | Ht 61.5 in | Wt 173.0 lb

## 2021-11-19 DIAGNOSIS — Z01419 Encounter for gynecological examination (general) (routine) without abnormal findings: Secondary | ICD-10-CM

## 2021-12-02 ENCOUNTER — Encounter: Payer: Self-pay | Admitting: Medical-Surgical

## 2021-12-03 ENCOUNTER — Other Ambulatory Visit (HOSPITAL_COMMUNITY): Payer: Self-pay

## 2021-12-03 MED ORDER — VALACYCLOVIR HCL 1 G PO TABS
ORAL_TABLET | ORAL | 3 refills | Status: DC
  Filled 2021-12-03: qty 12, 3d supply, fill #0
  Filled 2022-08-09: qty 12, 3d supply, fill #1

## 2022-02-18 ENCOUNTER — Other Ambulatory Visit: Payer: Self-pay | Admitting: Medical-Surgical

## 2022-03-17 ENCOUNTER — Ambulatory Visit (INDEPENDENT_AMBULATORY_CARE_PROVIDER_SITE_OTHER): Payer: BC Managed Care – PPO | Admitting: Medical-Surgical

## 2022-03-17 ENCOUNTER — Ambulatory Visit (INDEPENDENT_AMBULATORY_CARE_PROVIDER_SITE_OTHER): Payer: BC Managed Care – PPO

## 2022-03-17 ENCOUNTER — Encounter: Payer: Self-pay | Admitting: Medical-Surgical

## 2022-03-17 VITALS — BP 154/94 | HR 60 | Temp 99.5°F | Ht 61.5 in | Wt 170.0 lb

## 2022-03-17 DIAGNOSIS — M25512 Pain in left shoulder: Secondary | ICD-10-CM

## 2022-03-17 DIAGNOSIS — Z8739 Personal history of other diseases of the musculoskeletal system and connective tissue: Secondary | ICD-10-CM

## 2022-03-17 DIAGNOSIS — Z0000 Encounter for general adult medical examination without abnormal findings: Secondary | ICD-10-CM | POA: Diagnosis not present

## 2022-03-17 DIAGNOSIS — Z23 Encounter for immunization: Secondary | ICD-10-CM | POA: Diagnosis not present

## 2022-03-17 DIAGNOSIS — M25552 Pain in left hip: Secondary | ICD-10-CM | POA: Diagnosis not present

## 2022-03-17 DIAGNOSIS — I10 Essential (primary) hypertension: Secondary | ICD-10-CM | POA: Diagnosis not present

## 2022-03-17 DIAGNOSIS — R232 Flushing: Secondary | ICD-10-CM

## 2022-03-17 DIAGNOSIS — F419 Anxiety disorder, unspecified: Secondary | ICD-10-CM

## 2022-03-17 DIAGNOSIS — F32A Depression, unspecified: Secondary | ICD-10-CM

## 2022-03-17 MED ORDER — VENLAFAXINE HCL ER 37.5 MG PO CP24: 37.5000 mg | ORAL_CAPSULE | Freq: Every day | ORAL | 0 refills | Status: DC

## 2022-03-17 MED ORDER — MELOXICAM 15 MG PO TABS: 15.0000 mg | ORAL_TABLET | Freq: Every day | ORAL | 0 refills | Status: DC

## 2022-03-17 MED ORDER — HYDROCHLOROTHIAZIDE 25 MG PO TABS: 25.0000 mg | ORAL_TABLET | Freq: Every day | ORAL | 1 refills | Status: DC

## 2022-03-17 MED ORDER — VENLAFAXINE HCL ER 75 MG PO CP24: 75.0000 mg | ORAL_CAPSULE | Freq: Every day | ORAL | 1 refills | Status: DC

## 2022-03-17 NOTE — Progress Notes (Signed)
Complete physical exam  Patient: Kelly Simmons   DOB: [birth year]   46 y.o. Female  MRN: [id number]  Subjective:    Chief Complaint  Patient presents with   Annual Exam    Kelly Simmons is a 46 y.o. female who presents today for a complete physical exam. She reports consuming a  vegetarian diet with fish, eggs, and cheese . Home exercise routine includes yoga and walking. She generally feels fairly well. She reports sleeping well. She does have additional problems to discuss today.   Mood: notes that she is struggling with anxiety and depression lately. She has a lot going on in life and is having a hard time handling it all. Also has some significant anxiety regarding her health as she is now 46 years old and that is the age her mother was diagnosed with inflammatory breast cancer. She is taking Cymbalta '30mg'$  daily and has been on this quite a while. Also reports night sweats and hot flashes that are severe at times.   HTN: blood pressure is up today but she is notably stressed and tearful. She has been taking HCTZ '25mg'$  daily and reports that her home readings have been good on that lately. Limits dietary sodium. Exercise as above.   Left shoulder pain: reports about 6 weeks of left shoulder pain along the backside of the rotator cuff and radiating down the arm to the elbow and the ulnar aspect of the wrist. She does prefer to sleep on the left side but this is causing her symptoms to be worse and it is interfering with her sleep.   Left hip pain: has also had left hip pain that started about the time of her shoulder pain. When asked where the pain is, she demonstrates the "C" position from the anterior hip around to the lateral/back of the hip. Has tried Aleve and she notes that it helps sometimes. Not taking the medication regularly.  Most recent fall risk assessment:    03/17/2022    9:21 AM  [place] in the past year? 0  Number falls in past yr: 0  Injury with Fall?  0  Risk for fall due to : No Fall Risks  Follow up Falls evaluation completed     Most recent depression screenings:    06/08/2021    1:27 PM 09/25/2020   10:04 AM  PHQ 2/9 Scores  PHQ - 2 Score 2 5  PHQ- 9 Score 6 11    Vision:Within last year, Dental: No current dental problems and Receives regular dental care, and STD: The patient reports a past history of: herpes    Patient Care Team: Samuel Bouche, NP as PCP - General (Nurse Practitioner)   Outpatient Medications Prior to Visit  Medication Sig   albuterol (VENTOLIN HFA) 108 (90 Base) MCG/ACT inhaler Inhale 1-2 puffs into the lungs every 4 (four) hours as needed for wheezing or shortness of breath.   BIOTIN PO Take by mouth.   buPROPion (WELLBUTRIN XL) 300 MG 24 hr tablet TAKE 1 TABLET BY MOUTH DAILY.   Cholecalciferol 100 MCG (4000 UT) CAPS Take by mouth.   valACYclovir (VALTREX) 1000 MG tablet Take 2 tablets by mouth twice daily for one day as need for cold sore   [DISCONTINUED] DULoxetine (CYMBALTA) 30 MG capsule Take 1 capsule (30 mg total) by mouth daily.   [DISCONTINUED] hydrochlorothiazide (HYDRODIURIL) 25 MG tablet TAKE 0.5 TABLETS (12.5 MG TOTAL) BY MOUTH DAILY. NEEDS APPOINTMENT FOR FURTHER  REFILLS   No facility-administered medications prior to visit.    Review of Systems  Constitutional:  Positive for diaphoresis (night sweats/hot flashes) and malaise/fatigue. Negative for chills, fever and weight loss.  HENT:  Negative for congestion, ear pain, sinus pain and sore throat.   Respiratory: Negative.    Cardiovascular: Negative.   Gastrointestinal:  Positive for heartburn and nausea. Negative for abdominal pain, blood in stool, constipation, diarrhea, melena and vomiting.  Genitourinary: Negative.   Musculoskeletal:  Positive for joint pain (left shoulder and hip).  Skin:  Negative for itching and rash.  Neurological:  Negative for dizziness, weakness and headaches.  Psychiatric/Behavioral:  Positive for  depression. Negative for suicidal ideas. The patient is nervous/anxious and has insomnia.      Objective:     BP (!) 154/94 (BP Location: Left Arm, Patient Position: Sitting, Cuff Size: Large)   Pulse 60   Temp 99.5 F (37.5 C) (Oral)   Ht 5' 1.5" (1.562 m)   Wt 170 lb 0.6 oz (77.1 kg)   SpO2 100%   BMI 31.61 kg/m    Physical Exam Constitutional:      General: She is not in acute distress.    Appearance: Normal appearance. She is obese. She is not ill-appearing.  HENT:     Head: Normocephalic and atraumatic.     Right Ear: Tympanic membrane, ear canal and external ear normal. There is no impacted cerumen.     Left Ear: Tympanic membrane, ear canal and external ear normal. There is no impacted cerumen.     Nose: Nose normal.     Mouth/Throat:     Mouth: Mucous membranes are moist.     Pharynx: No oropharyngeal exudate or posterior oropharyngeal erythema.  Eyes:     General: No scleral icterus.       Right eye: No discharge.        Left eye: No discharge.     Extraocular Movements: Extraocular movements intact.     Conjunctiva/sclera: Conjunctivae normal.     Pupils: Pupils are equal, round, and reactive to light.  Neck:     Thyroid: No thyromegaly.     Vascular: No carotid bruit or JVD.     Trachea: Trachea normal.  Cardiovascular:     Rate and Rhythm: Normal rate and regular rhythm.     Pulses: Normal pulses.     Heart sounds: Normal heart sounds. No murmur heard.    No friction rub. No gallop.  Pulmonary:     Effort: Pulmonary effort is normal. No respiratory distress.     Breath sounds: Normal breath sounds. No wheezing.  Abdominal:     General: Bowel sounds are normal. There is no distension.     Palpations: Abdomen is soft.     Tenderness: There is no abdominal tenderness. There is no guarding.  Musculoskeletal:        General: Normal range of motion.     Cervical back: Normal range of motion and neck supple.  Lymphadenopathy:     Cervical: No cervical  adenopathy.  Skin:    General: Skin is warm and dry.  Neurological:     Mental Status: She is alert and oriented to person, place, and time.     Cranial Nerves: No cranial nerve deficit.  Psychiatric:        Mood and Affect: Mood normal.        Behavior: Behavior normal.        Thought Content: Thought content normal.  Judgment: Judgment normal.   No results found for any visits on 03/17/22.     Assessment & Plan:    Routine Health Maintenance and Physical Exam  Immunization History  Administered Date(s) Administered   Influenza,inj,Quad PF,6+ Mos 05/12/2016, 04/21/2018, 03/28/2019, 05/27/2021, 03/17/2022   Influenza-Unspecified 04/10/2012, 04/04/2013, 04/29/2014, 05/11/2015, 04/17/2017, 04/21/2018   PFIZER(Purple Top)SARS-COV-2 Vaccination 09/12/2019, 10/03/2019, 05/06/2020   Pfizer Covid-19 Vaccine Bivalent Booster 37yr & up 06/18/2021   Tdap 12/01/2008, 12/11/2008, 07/12/2018    Health Maintenance  Topic Date Due   TETANUS/TDAP  07/12/2028   COLONOSCOPY (Pts 45-484yrInsurance coverage will need to be confirmed)  04/29/2030   INFLUENZA VACCINE  Completed   COVID-19 Vaccine  Completed   Hepatitis C Screening  Completed   HIV Screening  Completed   HPV VACCINES  Aged Out    Discussed health benefits of physical activity, and encouraged her to engage in regular exercise appropriate for her age and condition.  1. Annual physical exam Checking labs as below.  Up-to-date on preventative care.  Wellness information provided with AVS. - CBC with Differential/Platelet - COMPLETE METABOLIC PANEL WITH GFR - Lipid panel  2. Essential hypertension Blood pressure elevated on arrival and still elevated at recheck.  Suspect this is likely due to her current level of stress.  Would like to continue hydrochlorothiazide 25 mg daily and have her monitor this at home.  See below for stress management recommendations.  3. Anxiety and depression She is very worried about the  possibility of menopause as she has had a hysterectomy but does still have her ovaries.  Notes her mother started menopause at her age and would like to have labs checked to see where she is in the process.  Checking FSH/LH today.  Discussed her current dose of Cymbalta.  She is on a very low dose and has been on it for quite a while.  Would like to switch her over to Effexor as this is more traditionally known to help with menopausal symptoms as well as fibromyalgia.  Discussed the recommendation for switching from Cymbalta to Effexor.  As this is an SNRI to SNRI, she is okay to stop the Cymbalta and start the Effexor the next day.  We will start with 37.5 mg daily for 1 week and then increase her dose to 75 mg if it is well-tolerated.  Advised that should she have any concerns or worsening symptoms, she should reach out to [place]. - FSH/LH  4. History of fibromyalgia Patient is unsure if this was an accurate diagnosis or not.  She has not had any issues or symptoms of fibromyalgia in quite a while.  Switching from Cymbalta to Effexor.  Advised her to watch out for any worsening muscle aches/pains and fatigue with the change in medication.  5. Need for influenza vaccination Flu vaccine given in office today. - Flu Vaccine QUAD 6+ mos PF IM (Fluarix Quad PF)  6. Acute pain of left shoulder Getting cervical spine x-rays today.  Suspect this is more of a cervical issue rather than a shoulder etiology.  Discussed the use of anti-inflammatories.  She would like to have a once daily dose to medication so sending in meloxicam 15 mg daily. - DG Cervical Spine Complete; Future  7. Left hip pain Getting left hip x-rays today for further evaluation.  Possible osteoarthritis versus labral tear.  See above for anti-inflammatory recommendations. - DG Hip Unilat W OR W/O Pelvis 2-3 Views Left; Future  8. Vasomotor flushing Checking  FSH/LH. - FSH/LH  Return in about 6 weeks (around 04/28/2022) for mood  follow up.   Samuel Bouche, NP

## 2022-03-19 LAB — LIPID PANEL
Cholesterol: 224 mg/dL — ABNORMAL HIGH (ref <200)
HDL: 89 mg/dL (ref >=50)
LDL Cholesterol (Calc): 118 mg/dL (calc) — ABNORMAL HIGH
Non-HDL Cholesterol (Calc): 135 mg/dL (calc) — ABNORMAL HIGH (ref <130)
Total CHOL/HDL Ratio: 2.5 (calc) (ref <5.0)
Triglycerides: 71 mg/dL (ref <150)

## 2022-03-19 LAB — CBC WITH DIFFERENTIAL/PLATELET
Absolute Monocytes: 315 cells/uL (ref 200–950)
Basophils Absolute: 30 cells/uL (ref 0–200)
Basophils Relative: 0.8 %
Eosinophils Absolute: 152 cells/uL (ref 15–500)
Eosinophils Relative: 4.1 %
HCT: 40.6 % (ref 35.0–45.0)
Hemoglobin: 12.9 g/dL (ref 11.7–15.5)
Lymphs Abs: 2028 cells/uL (ref 850–3900)
MCH: 24.2 pg — ABNORMAL LOW (ref 27.0–33.0)
MCHC: 31.8 g/dL — ABNORMAL LOW (ref 32.0–36.0)
MCV: 76 fL — ABNORMAL LOW (ref 80.0–100.0)
MPV: 11.1 fL (ref 7.5–12.5)
Monocytes Relative: 8.5 %
Neutro Abs: 1177 cells/uL — ABNORMAL LOW (ref 1500–7800)
Neutrophils Relative %: 31.8 %
Platelets: 289 10*3/uL (ref 140–400)
RBC: 5.34 10*6/uL — ABNORMAL HIGH (ref 3.80–5.10)
RDW: 14.9 % (ref 11.0–15.0)
Total Lymphocyte: 54.8 %
WBC: 3.7 10*3/uL — ABNORMAL LOW (ref 3.8–10.8)

## 2022-03-19 LAB — COMPLETE METABOLIC PANEL WITH GFR
AG Ratio: 1.7 (calc) (ref 1.0–2.5)
ALT: 11 U/L (ref 6–29)
AST: 13 U/L (ref 10–35)
Albumin: 4.7 g/dL (ref 3.6–5.1)
Alkaline phosphatase (APISO): 44 U/L (ref 31–125)
BUN: 14 mg/dL (ref 7–25)
CO2: 28 mmol/L (ref 20–32)
Calcium: 9.8 mg/dL (ref 8.6–10.2)
Chloride: 103 mmol/L (ref 98–110)
Creat: 0.9 mg/dL (ref 0.50–0.99)
Globulin: 2.7 g/dL (calc) (ref 1.9–3.7)
Glucose, Bld: 89 mg/dL (ref 65–99)
Potassium: 4.1 mmol/L (ref 3.5–5.3)
Sodium: 139 mmol/L (ref 135–146)
Total Bilirubin: 0.6 mg/dL (ref 0.2–1.2)
Total Protein: 7.4 g/dL (ref 6.1–8.1)
eGFR: 79 mL/min/{1.73_m2} (ref >=60)

## 2022-03-19 LAB — IRON,TIBC AND FERRITIN PANEL
%SAT: 28 % (calc) (ref 16–45)
Ferritin: 20 ng/mL (ref 16–232)
Iron: 90 ug/dL (ref 40–190)
TIBC: 326 mcg/dL (calc) (ref 250–450)

## 2022-03-19 LAB — TEST AUTHORIZATION

## 2022-03-19 LAB — FSH/LH
FSH: 67 m[IU]/mL
LH: 43.2 m[IU]/mL

## 2022-03-23 ENCOUNTER — Ambulatory Visit: Payer: BC Managed Care – PPO | Admitting: Physical Therapy

## 2022-03-24 ENCOUNTER — Encounter: Payer: Self-pay | Admitting: Medical-Surgical

## 2022-04-12 ENCOUNTER — Other Ambulatory Visit: Payer: Self-pay | Admitting: Medical-Surgical

## 2022-04-12 MED ORDER — MELOXICAM 15 MG PO TABS: 15.0000 mg | ORAL_TABLET | Freq: Every day | ORAL | 1 refills | Status: DC

## 2022-04-23 ENCOUNTER — Encounter: Payer: Self-pay | Admitting: Medical-Surgical

## 2022-04-27 NOTE — Progress Notes (Unsigned)
   Established Patient Office Visit  Subjective   Patient ID: Kelly Simmons, female   DOB: 02/17/1976 Age: 46 y.o. MRN: [id number]   No chief complaint on file.   HPI    Objective:    There were no vitals filed for this visit.  Physical Exam   No results found for this or any previous visit (from the past 24 hour(s)).   {Labs (Optional):23779}  The 10-year ASCVD risk score (Arnett DK, et al., 2019) is: 3.4%   Values used to calculate the score:     Age: 46 years     Sex: Female     Is Non-Hispanic African American: Yes     Diabetic: No     Tobacco smoker: No     Systolic Blood Pressure: 005 mmHg     Is BP treated: Yes     HDL Cholesterol: 89 mg/dL     Total Cholesterol: 224 mg/dL   Assessment & Plan:   No problem-specific Assessment & Plan notes found for this encounter.   No follow-ups on file.  ___________________________________________ Clearnce Sorrel, DNP, APRN, FNP-BC [facility] [place]

## 2022-04-28 ENCOUNTER — Ambulatory Visit (INDEPENDENT_AMBULATORY_CARE_PROVIDER_SITE_OTHER): Payer: BC Managed Care – PPO | Admitting: Medical-Surgical

## 2022-04-28 ENCOUNTER — Encounter: Payer: Self-pay | Admitting: Medical-Surgical

## 2022-04-28 DIAGNOSIS — F419 Anxiety disorder, unspecified: Secondary | ICD-10-CM | POA: Diagnosis not present

## 2022-04-28 DIAGNOSIS — I10 Essential (primary) hypertension: Secondary | ICD-10-CM | POA: Diagnosis not present

## 2022-04-28 DIAGNOSIS — M25512 Pain in left shoulder: Secondary | ICD-10-CM

## 2022-04-28 DIAGNOSIS — R232 Flushing: Secondary | ICD-10-CM | POA: Diagnosis not present

## 2022-04-28 DIAGNOSIS — F32A Depression, unspecified: Secondary | ICD-10-CM

## 2022-05-16 ENCOUNTER — Other Ambulatory Visit: Payer: Self-pay | Admitting: Medical-Surgical

## 2022-07-20 ENCOUNTER — Encounter: Payer: Self-pay | Admitting: Medical-Surgical

## 2022-07-27 ENCOUNTER — Other Ambulatory Visit: Payer: Self-pay | Admitting: Medical-Surgical

## 2022-08-09 ENCOUNTER — Encounter: Payer: Self-pay | Admitting: Medical-Surgical

## 2022-08-09 ENCOUNTER — Other Ambulatory Visit (HOSPITAL_COMMUNITY): Payer: Self-pay

## 2022-08-27 ENCOUNTER — Other Ambulatory Visit: Payer: Self-pay | Admitting: Pharmacist

## 2022-08-27 NOTE — Progress Notes (Signed)
Patient appearing on report for [employer] [facility] - Hypertension Control report due to last documented ambulatory blood pressure of 154/94 on 03/17/22. Next appointment with PCP is not scheduled.   Outreached patient to discuss hypertension control and medication management. Left voicemail for patient to return my call at their convenience.   Larinda Buttery, PharmD Clinical Pharmacist [facility] [phone]

## 2022-09-10 ENCOUNTER — Other Ambulatory Visit: Payer: Self-pay | Admitting: Medical-Surgical

## 2022-09-10 DIAGNOSIS — Z1231 Encounter for screening mammogram for malignant neoplasm of breast: Secondary | ICD-10-CM

## 2022-09-14 DIAGNOSIS — Z1231 Encounter for screening mammogram for malignant neoplasm of breast: Secondary | ICD-10-CM

## 2022-10-12 ENCOUNTER — Telehealth: Payer: Self-pay | Admitting: Pharmacist

## 2022-10-12 NOTE — Progress Notes (Signed)
Patient attempted to be outreached by Alex Perez, PharmD Candidate on 10/12/2022 to discuss hypertension. Left voicemail for patient to return our call at their convenience at [phone].   Alex Perez, PharmD Candidate   Catie T. Syvilla Martin, PharmD, BCACP, CPP [facility] [phone]  

## 2022-11-20 ENCOUNTER — Other Ambulatory Visit: Payer: Self-pay | Admitting: Medical-Surgical

## 2022-11-24 ENCOUNTER — Telehealth: Payer: No Typology Code available for payment source | Admitting: Physician Assistant

## 2022-11-24 DIAGNOSIS — J208 Acute bronchitis due to other specified organisms: Secondary | ICD-10-CM

## 2022-11-25 MED ORDER — AZITHROMYCIN 250 MG PO TABS: ORAL_TABLET | ORAL | 0 refills | Status: AC

## 2022-11-25 MED ORDER — BENZONATATE 100 MG PO CAPS: 100.0000 mg | ORAL_CAPSULE | Freq: Three times a day (TID) | ORAL | 0 refills | Status: DC | PRN

## 2022-11-25 NOTE — Progress Notes (Signed)
I have spent 5 minutes in review of e-visit questionnaire, review and updating patient chart, medical decision making and response to patient.   Lorissa Kishbaugh Cody Myrissa Chipley, PA-C    

## 2022-11-25 NOTE — Progress Notes (Signed)
E-Visit for Cough  We are sorry that you are not feeling well.  Here is how we plan to help!  Based on your presentation I believe you most likely have A cough due to a virus.  This is called viral bronchitis and is best treated by rest, plenty of fluids and control of the cough.  You may use Ibuprofen or Tylenol as directed to help your symptoms.     In addition, I have sent in  A prescription cough medication called Tessalon Perles 100mg . You may take 1-2 capsules every 8 hours as needed for your cough. Giving history and change in sputum color, I have put an antibiotic on file at the pharmacy to start if symptoms are not improving with other measures over the net 48-72 hours.    From your responses in the eVisit questionnaire you describe inflammation in the upper respiratory tract which is causing a significant cough.  This is commonly called Bronchitis and has four common causes:   Allergies Viral Infections Acid Reflux Bacterial Infection Allergies, viruses and acid reflux are treated by controlling symptoms or eliminating the cause. An example might be a cough caused by taking certain blood pressure medications. You stop the cough by changing the medication. Another example might be a cough caused by acid reflux. Controlling the reflux helps control the cough.  USE OF BRONCHODILATOR ("RESCUE") INHALERS: There is a risk from using your bronchodilator too frequently.  The risk is that over-reliance on a medication which only relaxes the muscles surrounding the breathing tubes can reduce the effectiveness of medications prescribed to reduce swelling and congestion of the tubes themselves.  Although you feel brief relief from the bronchodilator inhaler, your asthma may actually be worsening with the tubes becoming more swollen and filled with mucus.  This can delay other crucial treatments, such as oral steroid medications. If you need to use a bronchodilator inhaler daily, several times per day,  you should discuss this with your provider.  There are probably better treatments that could be used to keep your asthma under control.     HOME CARE Only take medications as instructed by your medical team. Complete the entire course of an antibiotic. Drink plenty of fluids and get plenty of rest. Avoid close contacts especially the very young and the elderly Cover your mouth if you cough or cough into your sleeve. Always remember to wash your hands A steam or ultrasonic humidifier can help congestion.   GET HELP RIGHT AWAY IF: You develop worsening fever. You become short of breath You cough up blood. Your symptoms persist after you have completed your treatment plan MAKE SURE YOU  Understand these instructions. Will watch your condition. Will get help right away if you are not doing well or get worse.    Thank you for choosing an e-visit.  Your e-visit answers were reviewed by a board certified advanced clinical practitioner to complete your personal care plan. Depending upon the condition, your plan could have included both over the counter or prescription medications.  Please review your pharmacy choice. Make sure the pharmacy is open so you can pick up prescription now. If there is a problem, you may contact your provider through [employer] and have the prescription routed to another pharmacy.  Your safety is important to [place]. If you have drug allergies check your prescription carefully.   For the next 24 hours you can use MyChart to ask questions about today's visit, request a non-urgent call back, or  ask for a work or school excuse. You will get an email in the next two days asking about your experience. I hope that your e-visit has been valuable and will speed your recovery.

## 2022-11-29 ENCOUNTER — Other Ambulatory Visit: Payer: Self-pay | Admitting: Medical-Surgical

## 2022-11-29 DIAGNOSIS — Z1231 Encounter for screening mammogram for malignant neoplasm of breast: Secondary | ICD-10-CM

## 2022-12-01 ENCOUNTER — Ambulatory Visit (INDEPENDENT_AMBULATORY_CARE_PROVIDER_SITE_OTHER): Payer: No Typology Code available for payment source

## 2022-12-01 DIAGNOSIS — Z1231 Encounter for screening mammogram for malignant neoplasm of breast: Secondary | ICD-10-CM

## 2022-12-21 NOTE — Progress Notes (Deleted)
   ANNUAL EXAM Patient name: Kelly Simmons MRN [id number]  Date of birth: 02/12/1976 Chief Complaint:   No chief complaint on file.  History of Present Illness:   Kelly Simmons is a 47 y.o. G40P2002 female being seen today for a routine annual exam.   Current complaints: ***   No LMP recorded. Patient has had a hysterectomy.   Last MXR: 11/2022 Last Pap/Pap History: Normal pap hx. S/p LAVH for bleeding.    Health Maintenance Due  Topic Date Due   COVID-19 Vaccine (5 - 2023-24 season) 02/26/2022    Review of Systems:   Pertinent items are noted in HPI Denies any headaches, blurred vision, fatigue, shortness of breath, chest pain, abdominal pain, abnormal vaginal discharge/itching/odor/irritation, problems with periods, bowel movements, urination, or intercourse unless otherwise stated above. *** Pertinent History Reviewed:  Reviewed past medical,surgical, social and family history.  Reviewed problem list, medications and allergies. Physical Assessment:  There were no vitals filed for this visit.There is no height or weight on file to calculate BMI.   Physical Examination:  General appearance - well appearing, and in no distress Mental status - alert, oriented to person, place, and time Psych:  She has a normal mood and affect Skin - warm and dry, normal color, no suspicious lesions noted Chest - effort normal Heart - normal rate  Breasts - breasts appear normal, no suspicious masses, no skin or nipple changes or axillary nodes Abdomen - soft, nontender, nondistended, no masses or organomegaly Pelvic -  VULVA: normal appearing vulva with no masses, tenderness or lesions  VAGINA: normal appearing vagina with normal color and discharge, no lesions  CERVIX/UTERUS: surgically absent.   ADNEXA: No adnexal masses or tenderness noted. Extremities:  No swelling or varicosities noted  Chaperone present for exam  No results found for this or any previous visit (from the  past 24 hour(s)).  Assessment & Plan:  Diagnoses and all orders for this visit:  Encounter for annual routine gynecological examination  - Cervical cancer screening: No longer indicated s/p LAVH for benign reasons.  - STD Testing: not indicated - Breast Health: Encouraged self breast awareness/SBE. Teaching provided. Discussed limits of clinical breast exam for detecting breast cancer. MXR was wnl on 11/2022. (She has [facility] of inflammatory breast cancer - mother, age 5, diagnosed by MXR).  - F/U 12 months and prn. Previously offered q2y but she prefers annually.     No orders of the defined types were placed in this encounter.   Meds: No orders of the defined types were placed in this encounter.   Follow-up: No follow-ups on file.  Milas Hock, MD 12/21/2022 1:18 PM

## 2022-12-23 ENCOUNTER — Ambulatory Visit: Payer: No Typology Code available for payment source | Admitting: Obstetrics and Gynecology

## 2022-12-23 DIAGNOSIS — Z01419 Encounter for gynecological examination (general) (routine) without abnormal findings: Secondary | ICD-10-CM

## 2023-01-11 NOTE — Progress Notes (Deleted)
Last mammogram- 12/01/22- negative

## 2023-01-12 ENCOUNTER — Ambulatory Visit: Payer: No Typology Code available for payment source | Admitting: Obstetrics and Gynecology

## 2023-02-03 ENCOUNTER — Encounter: Payer: Self-pay | Admitting: Medical-Surgical

## 2023-02-15 ENCOUNTER — Other Ambulatory Visit: Payer: Self-pay | Admitting: Medical-Surgical

## 2023-03-01 ENCOUNTER — Telehealth: Payer: BC Managed Care – PPO | Admitting: Physician Assistant

## 2023-03-01 ENCOUNTER — Encounter: Payer: Self-pay | Admitting: Physician Assistant

## 2023-03-01 DIAGNOSIS — Z20822 Contact with and (suspected) exposure to covid-19: Secondary | ICD-10-CM

## 2023-03-01 MED ORDER — MOLNUPIRAVIR EUA 200MG CAPSULE
4.0000 | ORAL_CAPSULE | Freq: Two times a day (BID) | ORAL | 0 refills | Status: DC
Start: 2023-03-01 — End: 2023-03-02

## 2023-03-01 MED ORDER — PROMETHAZINE-DM 6.25-15 MG/5ML PO SYRP: 5.0000 mL | ORAL_SOLUTION | Freq: Four times a day (QID) | ORAL | 0 refills | Status: DC | PRN

## 2023-03-01 NOTE — Patient Instructions (Signed)
Olegario Shearer, thank you for joining [name], PA-C for today's virtual visit.  While this provider is not your primary care provider (PCP), if your PCP is located in our provider database this encounter information will be shared with them immediately following your visit.   A [facility] MyChart account gives you access to today's visit and all your visits, tests, and labs performed at [facility] " click here if you don't have a [facility] MyChart account or go to [email or web address]  Consent: (Patient) Olegario Shearer provided verbal consent for this virtual visit at the beginning of the encounter.  Current Medications:  Current Outpatient Medications:    promethazine-dextromethorphan (PROMETHAZINE-DM) 6.25-15 MG/5ML syrup, Take 5 mLs by mouth 4 (four) times daily as needed for cough., Disp: 118 mL, Rfl: 0   albuterol (VENTOLIN HFA) 108 (90 Base) MCG/ACT inhaler, Inhale 1-2 puffs into the lungs every 4 (four) hours as needed for wheezing or shortness of breath., Disp: 18 g, Rfl: 0   BIOTIN PO, Take by mouth., Disp: , Rfl:    Cholecalciferol 100 MCG (4000 UT) CAPS, Take by mouth., Disp: , Rfl:    hydrochlorothiazide (HYDRODIURIL) 25 MG tablet, TAKE 1 TABLET (25 MG TOTAL) BY MOUTH DAILY. NEEDS APPOINTMENT FOR FURTHER REFILLS., Disp: 30 tablet, Rfl: 0   meloxicam (MOBIC) 15 MG tablet, Take 1 tablet (15 mg total) by mouth daily., Disp: 90 tablet, Rfl: 1   valACYclovir (VALTREX) 1000 MG tablet, Take 2 tablets by mouth twice daily for one day as need for cold sore, Disp: 12 tablet, Rfl: 3   venlafaxine XR (EFFEXOR-XR) 75 MG 24 hr capsule, TAKE 1 CAPSULE BY MOUTH DAILY WITH BREAKFAST., Disp: 90 capsule, Rfl: 0   Medications ordered in this encounter:  Meds ordered this encounter  Medications   promethazine-dextromethorphan (PROMETHAZINE-DM) 6.25-15 MG/5ML syrup    Sig: Take 5 mLs by mouth 4 (four) times daily as needed for cough.    Dispense:  118 mL     Refill:  0    Order Specific Question:   Supervising Provider    Answer:   Merrilee Jansky [[id number]]     *If you need refills on other medications prior to your next appointment, please contact your pharmacy*  Follow-Up: Call back or seek an in-person evaluation if the symptoms worsen or if the condition fails to improve as anticipated.  [facility] [phone]  Care Instructions: Please keep well-hydrated and get plenty of rest. Start a saline nasal rinse to flush out your nasal passages. You can use plain Mucinex to help thin congestion. Take the Molnupiravir as directed. If you have a humidifier, running in the bedroom at night. I want you to start OTC vitamin D3 1000 units daily, vitamin C 1000 mg daily, and a zinc supplement. Please take prescribed medications as directed.      Isolation Instructions: You are to isolate at home until you have been fever free for at least 24 hours without a fever-reducing medication, and symptoms have been steadily improving for 24 hours. At that time,  you can end isolation but need to mask for an additional 5 days.   If you must be around other household members who do not have symptoms, you need to make sure that both you and the family members are masking consistently with a high-quality mask.  If you note any worsening of symptoms despite treatment, please seek an in-person evaluation ASAP. If you note any significant shortness of  breath or any chest pain, please seek ER evaluation. Please do not delay care!   COVID-19: What to Do if You Are Sick If you test positive and are an older adult or someone who is at high risk of getting very sick from COVID-19, treatment may be available. Contact a healthcare provider right away after a positive test to determine if you are eligible, even if your symptoms are mild right now. You can also visit a Test to Treat location and, if eligible, receive a prescription from a provider.  Don't delay: Treatment must be started within the first few days to be effective. If you have a fever, cough, or other symptoms, you might have COVID-19. Most people have mild illness and are able to recover at home. If you are sick: Keep track of your symptoms. If you have an emergency warning sign (including trouble breathing), call 911. Steps to help prevent the spread of COVID-19 if you are sick If you are sick with COVID-19 or think you might have COVID-19, follow the steps below to care for yourself and to help protect other people in your home and community. Stay home except to get medical care Stay home. Most people with COVID-19 have mild illness and can recover at home without medical care. Do not leave your home, except to get medical care. Do not visit public areas and do not go to places where you are unable to wear a mask. Take care of yourself. Get rest and stay hydrated. Take over-the-counter medicines, such as acetaminophen, to help you feel better. Stay in touch with your doctor. Call before you get medical care. Be sure to get care if you have trouble breathing, or have any other emergency warning signs, or if you think it is an emergency. Avoid public transportation, ride-sharing, or taxis if possible. Get tested If you have symptoms of COVID-19, get tested. While waiting for test results, stay away from others, including staying apart from those living in your household. Get tested as soon as possible after your symptoms start. Treatments may be available for people with COVID-19 who are at risk for becoming very sick. Don't delay: Treatment must be started early to be effective--some treatments must begin within 5 days of your first symptoms. Contact your healthcare provider right away if your test result is positive to determine if you are eligible. Self-tests are one of several options for testing for the virus that causes COVID-19 and may be more convenient than  laboratory-based tests and point-of-care tests. Ask your healthcare provider or your local health department if you need help interpreting your test results. You can visit your state, tribal, local, and territorial health department's website to look for the latest local information on testing sites. Separate yourself from other people As much as possible, stay in a specific room and away from other people and pets in your home. If possible, you should use a separate bathroom. If you need to be around other people or animals in or outside of the home, wear a well-fitting mask. Tell your close contacts that they may have been exposed to COVID-19. An infected person can spread COVID-19 starting 48 hours (or 2 days) before the person has any symptoms or tests positive. By letting your close contacts know they may have been exposed to COVID-19, you are helping to protect everyone. See COVID-19 and Animals if you have questions about pets. If you are diagnosed with COVID-19, someone from the health department may call you. Answer  the call to slow the spread. Monitor your symptoms Symptoms of COVID-19 include fever, cough, or other symptoms. Follow care instructions from your healthcare provider and local health department. Your local health authorities may give instructions on checking your symptoms and reporting information. When to seek emergency medical attention Look for emergency warning signs* for COVID-19. If someone is showing any of these signs, seek emergency medical care immediately: Trouble breathing Persistent pain or pressure in the chest New confusion Inability to wake or stay awake Pale, gray, or blue-colored skin, lips, or nail beds, depending on skin tone *This list is not all possible symptoms. Please call your medical provider for any other symptoms that are severe or concerning to you. Call 911 or call ahead to your local emergency facility: Notify the operator that you are seeking  care for someone who has or may have COVID-19. Call ahead before visiting your doctor Call ahead. Many medical visits for routine care are being postponed or done by phone or telemedicine. If you have a medical appointment that cannot be postponed, call your doctor's office, and tell them you have or may have COVID-19. This will help the office protect themselves and other patients. If you are sick, wear a well-fitting mask You should wear a mask if you must be around other people or animals, including pets (even at home). Wear a mask with the best fit, protection, and comfort for you. You don't need to wear the mask if you are alone. If you can't put on a mask (because of trouble breathing, for example), cover your coughs and sneezes in some other way. Try to stay at least 6 feet away from other people. This will help protect the people around you. Masks should not be placed on young children under age 16 years, anyone who has trouble breathing, or anyone who is not able to remove the mask without help. Cover your coughs and sneezes Cover your mouth and nose with a tissue when you cough or sneeze. Throw away used tissues in a lined trash can. Immediately wash your hands with soap and water for at least 20 seconds. If soap and water are not available, clean your hands with an alcohol-based hand sanitizer that contains at least 60% alcohol. Clean your hands often Wash your hands often with soap and water for at least 20 seconds. This is especially important after blowing your nose, coughing, or sneezing; going to the bathroom; and before eating or preparing food. Use hand sanitizer if soap and water are not available. Use an alcohol-based hand sanitizer with at least 60% alcohol, covering all surfaces of your hands and rubbing them together until they feel dry. Soap and water are the best option, especially if hands are visibly dirty. Avoid touching your eyes, nose, and mouth with unwashed  hands. Handwashing Tips Avoid sharing personal household items Do not share dishes, drinking glasses, cups, eating utensils, towels, or bedding with other people in your home. Wash these items thoroughly after using them with soap and water or put in the dishwasher. Clean surfaces in your home regularly Clean and disinfect high-touch surfaces (for example, doorknobs, tables, handles, light switches, and countertops) in your "sick room" and bathroom. In shared spaces, you should clean and disinfect surfaces and items after each use by the person who is ill. If you are sick and cannot clean, a caregiver or other person should only clean and disinfect the area around you (such as your bedroom and bathroom) on an as needed  basis. Your caregiver/other person should wait as long as possible (at least several hours) and wear a mask before entering, cleaning, and disinfecting shared spaces that you use. Clean and disinfect areas that may have blood, stool, or body fluids on them. Use household cleaners and disinfectants. Clean visible dirty surfaces with household cleaners containing soap or detergent. Then, use a household disinfectant. Use a product from [employer] List N: Disinfectants for Coronavirus (COVID-19). Be sure to follow the instructions on the label to ensure safe and effective use of the product. Many products recommend keeping the surface wet with a disinfectant for a certain period of time (look at "contact time" on the product label). You may also need to wear personal protective equipment, such as gloves, depending on the directions on the product label. Immediately after disinfecting, wash your hands with soap and water for 20 seconds. For completed guidance on cleaning and disinfecting your home, visit Complete Disinfection Guidance. Take steps to improve ventilation at home Improve ventilation (air flow) at home to help prevent from spreading COVID-19 to other people in your  household. Clear out COVID-19 virus particles in the air by opening windows, using air filters, and turning on fans in your home. Use this interactive tool to learn how to improve air flow in your home. When you can be around others after being sick with COVID-19 Deciding when you can be around others is different for different situations. Find out when you can safely end home isolation. For any additional questions about your care, contact your healthcare provider or state or local health department. 09/16/2020 Content source: [facility] for Immunization and Respiratory Diseases ([facility]), Division of Viral Diseases This information is not intended to replace advice given to you by your health care provider. Make sure you discuss any questions you have with your health care provider. Document Revised: 10/30/2020 Document Reviewed: 10/30/2020 Elsevier Patient Education  2022 [employer].     If you have been instructed to have an in-person evaluation today at a local Urgent Care facility, please use the link below. It will take you to a list of all of our available [facility], including address, phone number and hours of operation. Please do not delay care.  [facility]  If you or a family member do not have a primary care provider, use the link below to schedule a visit and establish care. When you choose a [facility] primary care physician or advanced practice provider, you gain a long-term partner in health. Find a Primary Care Provider  Learn more about [facility] in-office and virtual care options:  - Get Care Now

## 2023-03-01 NOTE — Progress Notes (Signed)
Virtual Visit Consent   Kelly Simmons, you are scheduled for a virtual visit with a [facility] provider today. Just as with appointments in the office, your consent must be obtained to participate. Your consent will be active for this visit and any virtual visit you may have with one of our providers in the next 365 days. If you have a MyChart account, a copy of this consent can be sent to you electronically.  As this is a virtual visit, video technology does not allow for your provider to perform a traditional examination. This may limit your provider's ability to fully assess your condition. If your provider identifies any concerns that need to be evaluated in person or the need to arrange testing (such as labs, EKG, etc.), we will make arrangements to do so. Although advances in technology are sophisticated, we cannot ensure that it will always work on either your end or our end. If the connection with a video visit is poor, the visit may have to be switched to a telephone visit. With either a video or telephone visit, we are not always able to ensure that we have a secure connection.  By engaging in this virtual visit, you consent to the provision of healthcare and authorize for your insurance to be billed (if applicable) for the services provided during this visit. Depending on your insurance coverage, you may receive a charge related to this service.  I need to obtain your verbal consent now. Are you willing to proceed with your visit today? [name] has provided verbal consent on 03/01/2023 for a virtual visit (video or telephone). Kelly Simmons, [place]  Date: 03/01/2023 3:07 PM  Virtual Visit via Video Note   I, Kelly Simmons, connected with  CHANDRIA MENACHO  ([id number], [birth year]) on 03/01/23 at 11:45 AM EDT by a video-enabled telemedicine application and verified that I am speaking with the correct person using two identifiers.  Location: Patient: Virtual Visit  Location Patient: Home Provider: Virtual Visit Location Provider: Home Office   I discussed the limitations of evaluation and management by telemedicine and the availability of in person appointments. The patient expressed understanding and agreed to proceed.    History of Present Illness: Kelly Simmons is a 47 y.o. who identifies as a female who was assigned female at birth, and is being seen today for 3 days of illness. Notes waking Sunday morning with a sore throat and substantial fatigue. Has been busy with work and been around others so tested for COVID as a precaution which was negative. Notes aches, sweats and chills without true fever. Denies chest pain but notes cough. COVID exposures through work. No known exposure to flu.   OTC -- Tylenol.   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   Essential hypertension 09/06/2018   Anxiety and depression 09/06/2018   Fibromyalgia 07/28/2015   Family history of breast cancer 07/13/2012    Allergies: No Known Allergies Medications:  Current Outpatient Medications:    molnupiravir EUA (LAGEVRIO) 200 mg CAPS capsule, Take 4 capsules (800 mg total) by mouth 2 (two) times daily for 5 days., Disp: 40 capsule, Rfl: 0   promethazine-dextromethorphan (PROMETHAZINE-DM) 6.25-15 MG/5ML syrup, Take 5 mLs by mouth 4 (four) times daily as needed for cough., Disp: 118 mL, Rfl: 0   albuterol (VENTOLIN HFA) 108 (90 Base) MCG/ACT inhaler, Inhale 1-2 puffs into the lungs every 4 (  four) hours as needed for wheezing or shortness of breath., Disp: 18 g, Rfl: 0   BIOTIN PO, Take by mouth., Disp: , Rfl:    Cholecalciferol 100 MCG (4000 UT) CAPS, Take by mouth., Disp: , Rfl:    hydrochlorothiazide (HYDRODIURIL) 25 MG tablet, TAKE 1 TABLET (25 MG TOTAL) BY MOUTH DAILY. NEEDS APPOINTMENT FOR FURTHER REFILLS., Disp: 30 tablet, Rfl: 0   meloxicam (MOBIC) 15 MG tablet, Take 1 tablet (15 mg total)  by mouth daily., Disp: 90 tablet, Rfl: 1   valACYclovir (VALTREX) 1000 MG tablet, Take 2 tablets by mouth twice daily for one day as need for cold sore, Disp: 12 tablet, Rfl: 3   venlafaxine XR (EFFEXOR-XR) 75 MG 24 hr capsule, TAKE 1 CAPSULE BY MOUTH DAILY WITH BREAKFAST., Disp: 90 capsule, Rfl: 0  Observations/Objective: Patient is well-developed, well-nourished in no acute distress.  Resting comfortably at home.  Head is normocephalic, atraumatic.  No labored breathing. Speech is clear and coherent with logical content.  Patient is alert and oriented at baseline.   Assessment and Plan: 1. Suspected COVID-19 virus infection - molnupiravir EUA (LAGEVRIO) 200 mg CAPS capsule; Take 4 capsules (800 mg total) by mouth 2 (two) times daily for 5 days.  Dispense: 40 capsule; Refill: 0  Retested for COVID and positive. Patient with multiple risk factors for complicated course of illness. Discussed risks/benefits of antiviral medications including most common potential ADRs. Patient voiced understanding and would like to proceed with antiviral medication. They are candidate for Molnupiravir. Rx sent to pharmacy. Supportive measures, OTC medications and vitamin regimen reviewed. Tessalon per orders. Quarantine reviewed in detail. Strict ER precautions discussed with patient.    Follow Up Instructions: I discussed the assessment and treatment plan with the patient. The patient was provided an opportunity to ask questions and all were answered. The patient agreed with the plan and demonstrated an understanding of the instructions.  A copy of instructions were sent to the patient via MyChart unless otherwise noted below.   The patient was advised to call back or seek an in-person evaluation if the symptoms worsen or if the condition fails to improve as anticipated.  Time:  I spent 10 minutes with the patient via telehealth technology discussing the above problems/concerns.    Kelly Climes,  PA-C

## 2023-03-02 ENCOUNTER — Other Ambulatory Visit: Payer: Self-pay | Admitting: Physician Assistant

## 2023-03-02 MED ORDER — NIRMATRELVIR/RITONAVIR (PAXLOVID)TABLET: 3.0000 | ORAL_TABLET | Freq: Two times a day (BID) | ORAL | 0 refills | Status: AC

## 2023-03-02 NOTE — Progress Notes (Deleted)
Virtual Visit Consent   [name], you are scheduled for a virtual visit with a [facility] provider today. Just as with appointments in the office, your consent must be obtained to participate. Your consent will be active for this visit and any virtual visit you may have with one of our providers in the next 365 days. If you have a MyChart account, a copy of this consent can be sent to you electronically.  As this is a virtual visit, video technology does not allow for your provider to perform a traditional examination. This may limit your provider's ability to fully assess your condition. If your provider identifies any concerns that need to be evaluated in person or the need to arrange testing (such as labs, EKG, etc.), we will make arrangements to do so. Although advances in technology are sophisticated, we cannot ensure that it will always work on either your end or our end. If the connection with a video visit is poor, the visit may have to be switched to a telephone visit. With either a video or telephone visit, we are not always able to ensure that we have a secure connection.  By engaging in this virtual visit, you consent to the provision of healthcare and authorize for your insurance to be billed (if applicable) for the services provided during this visit. Depending on your insurance coverage, you may receive a charge related to this service.  I need to obtain your verbal consent now. Are you willing to proceed with your visit today? Kelly Simmons has provided verbal consent on 03/02/2023 for a virtual visit (video or telephone). Kelly Simmons, [place]  Date: 03/02/2023 8:45 AM  Virtual Visit via Video Note   I, Kelly Simmons, connected with  Kelly Simmons  ([id number], [birth year]) on 03/02/23 at  by a video-enabled telemedicine application and verified that I am speaking with the correct person using two identifiers.  Location: Patient: {Virtual Visit Location  Patient:25492::"Home"} Provider: {Virtual Visit Location Provider:[id number]::"Office/Clinic"}   I discussed the limitations of evaluation and management by telemedicine and the availability of in person appointments. The patient expressed understanding and agreed to proceed.    History of Present Illness: Kelly Simmons is a 47 y.o. who identifies as a female who was assigned female at birth, and is being seen today for ***.  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   Essential hypertension 09/06/2018   Anxiety and depression 09/06/2018   Fibromyalgia 07/28/2015   Family history of breast cancer 07/13/2012    Allergies: No Known Allergies Medications:  Current Outpatient Medications:    albuterol (VENTOLIN HFA) 108 (90 Base) MCG/ACT inhaler, Inhale 1-2 puffs into the lungs every 4 (four) hours as needed for wheezing or shortness of breath., Disp: 18 g, Rfl: 0   BIOTIN PO, Take by mouth., Disp: , Rfl:    Cholecalciferol 100 MCG (4000 UT) CAPS, Take by mouth., Disp: , Rfl:    hydrochlorothiazide (HYDRODIURIL) 25 MG tablet, TAKE 1 TABLET (25 MG TOTAL) BY MOUTH DAILY. NEEDS APPOINTMENT FOR FURTHER REFILLS., Disp: 30 tablet, Rfl: 0   meloxicam (MOBIC) 15 MG tablet, Take 1 tablet (15 mg total) by mouth daily., Disp: 90 tablet, Rfl: 1   molnupiravir EUA (LAGEVRIO) 200 mg CAPS capsule, Take 4 capsules (800 mg total) by mouth 2 (two) times daily for 5 days., Disp: 40 capsule, Rfl: 0   promethazine-dextromethorphan (PROMETHAZINE-DM)  6.25-15 MG/5ML syrup, Take 5 mLs by mouth 4 (four) times daily as needed for cough., Disp: 118 mL, Rfl: 0   valACYclovir (VALTREX) 1000 MG tablet, Take 2 tablets by mouth twice daily for one day as need for cold sore, Disp: 12 tablet, Rfl: 3   venlafaxine XR (EFFEXOR-XR) 75 MG 24 hr capsule, TAKE 1 CAPSULE BY MOUTH DAILY WITH BREAKFAST., Disp: 90 capsule, Rfl: 0  Observations/Objective: Patient is  well-developed, well-nourished in no acute distress.  Resting comfortably *** at home.  Head is normocephalic, atraumatic.  No labored breathing. *** Speech is clear and coherent with logical content.  Patient is alert and oriented at baseline.  ***  Assessment and Plan: There are no diagnoses linked to this encounter. ***  Follow Up Instructions: I discussed the assessment and treatment plan with the patient. The patient was provided an opportunity to ask questions and all were answered. The patient agreed with the plan and demonstrated an understanding of the instructions.  A copy of instructions were sent to the patient via MyChart unless otherwise noted below.   {EMAIL AVS:[id number]::"Patient has requested to receive PHI (AVS, Work Notes, etc) pertaining to this video visit through e-mail as they are currently without active MyChart. They have voiced understand that email is not considered secure and their health information could be viewed by someone other than the patient. "}  The patient was advised to call back or seek an in-person evaluation if the symptoms worsen or if the condition fails to improve as anticipated.  Time:  I spent *** minutes with the patient via telehealth technology discussing the above problems/concerns.    Kelly Climes, PA-C

## 2023-03-15 NOTE — Progress Notes (Unsigned)
ANNUAL EXAM Patient name: Kelly Simmons MRN [id number]  Date of birth: 1976-01-28 Chief Complaint:   No chief complaint on file.  History of Present Illness:   Kelly Simmons is a 47 y.o. G12P2002 female being seen today for a routine annual exam.   Current concerns: None. Notes hot flashes on ROS but not disruptive to her. She manages them with temp control and they are primarily at night.   She does not have h/o abnormal paps and had her hysterectomy for bleeding.   No LMP recorded. Patient has had a hysterectomy.   Last MXR: 12/01/2022 Last Pap/Pap History: No h/o abnormal paps   Health Maintenance Due  Topic Date Due   INFLUENZA VACCINE  01/27/2023   COVID-19 Vaccine (5 - 2023-24 season) 02/27/2023    Review of Systems:   Pertinent items are noted in HPI Denies any headaches, blurred vision, fatigue, shortness of breath, chest pain, abdominal pain, abnormal vaginal discharge/itching/odor/irritation, problems with periods, bowel movements, urination, or intercourse unless otherwise stated above.  Pertinent History Reviewed:  Reviewed past medical,surgical, social and family history.  Reviewed problem list, medications and allergies. Physical Assessment:  There were no vitals filed for this visit.There is no height or weight on file to calculate BMI.   Physical Examination:  General appearance - well appearing, and in no distress Mental status - alert, oriented to person, place, and time Psych:  She has a normal mood and affect Skin - warm and dry, normal color, no suspicious lesions noted Chest - effort normal Heart - normal rate  Breasts - breasts appear normal, no suspicious masses, no skin or nipple changes or axillary nodes Abdomen - soft, nontender, nondistended, no masses or organomegaly Pelvic -  VULVA: normal appearing vulva with no masses, tenderness or lesions  VAGINA: normal appearing vagina with normal color and discharge, no lesions  CERVIX/UTERUS:  Surgically absent ADNEXA: No adnexal masses or tenderness noted. Extremities:  No swelling or varicosities noted  Chaperone present for exam  No results found for this or any previous visit (from the past 24 hour(s)).  Assessment & Plan:  Diagnoses and all orders for this visit:  Encounter for annual routine gynecological examination  - Cervical cancer screening: No longer indicated s/p LAVH for benign reasons.  - STD Testing: not indicated - Breast Health: Encouraged self breast awareness/SBE. Teaching provided. Discussed limits of clinical breast exam for detecting breast cancer. MXR was wnl on 11/2022. (She has [facility] of inflammatory breast cancer - mother, age 81, diagnosed by MXR).  - F/U 12 months and prn. Offered q2y but she prefers annually.     No orders of the defined types were placed in this encounter.   Meds: No orders of the defined types were placed in this encounter.   Follow-up: No follow-ups on file.  Milas Hock, MD 03/15/2023 2:31 PM

## 2023-03-17 ENCOUNTER — Ambulatory Visit (INDEPENDENT_AMBULATORY_CARE_PROVIDER_SITE_OTHER): Payer: BC Managed Care – PPO | Admitting: Obstetrics and Gynecology

## 2023-03-17 ENCOUNTER — Encounter: Payer: Self-pay | Admitting: Obstetrics and Gynecology

## 2023-03-17 VITALS — BP 159/86 | HR 67 | Ht 61.5 in | Wt 163.0 lb

## 2023-03-17 DIAGNOSIS — Z01419 Encounter for gynecological examination (general) (routine) without abnormal findings: Secondary | ICD-10-CM | POA: Diagnosis not present

## 2023-03-17 DIAGNOSIS — R61 Generalized hyperhidrosis: Secondary | ICD-10-CM

## 2023-03-17 DIAGNOSIS — Z23 Encounter for immunization: Secondary | ICD-10-CM | POA: Diagnosis not present

## 2023-03-17 MED ORDER — VENLAFAXINE HCL ER 150 MG PO CP24
150.0000 mg | ORAL_CAPSULE | Freq: Every day | ORAL | 3 refills | Status: DC
Start: 2023-03-17 — End: 2024-03-02

## 2023-03-17 MED ORDER — VENLAFAXINE HCL ER 75 MG PO CP24
150.0000 mg | ORAL_CAPSULE | Freq: Every day | ORAL | 3 refills | Status: DC
Start: 2023-03-17 — End: 2023-03-17

## 2023-03-17 NOTE — Addendum Note (Signed)
Addended by: Milas Hock A on: 03/17/2023 02:50 PM   Modules accepted: Orders

## 2023-03-22 ENCOUNTER — Other Ambulatory Visit: Payer: Self-pay | Admitting: Medical-Surgical

## 2023-04-04 ENCOUNTER — Other Ambulatory Visit (INDEPENDENT_AMBULATORY_CARE_PROVIDER_SITE_OTHER): Payer: BC Managed Care – PPO

## 2023-04-04 ENCOUNTER — Ambulatory Visit (INDEPENDENT_AMBULATORY_CARE_PROVIDER_SITE_OTHER): Payer: BC Managed Care – PPO | Admitting: Nurse Practitioner

## 2023-04-04 ENCOUNTER — Encounter: Payer: Self-pay | Admitting: Nurse Practitioner

## 2023-04-04 VITALS — BP 136/70 | HR 74 | Ht 61.5 in | Wt 164.0 lb

## 2023-04-04 DIAGNOSIS — Z85048 Personal history of other malignant neoplasm of rectum, rectosigmoid junction, and anus: Secondary | ICD-10-CM

## 2023-04-04 DIAGNOSIS — K629 Disease of anus and rectum, unspecified: Secondary | ICD-10-CM

## 2023-04-04 DIAGNOSIS — K649 Unspecified hemorrhoids: Secondary | ICD-10-CM

## 2023-04-04 LAB — CBC WITH DIFFERENTIAL/PLATELET
Basophils Absolute: 0 10*3/uL (ref 0.0–0.1)
Basophils Relative: 0.8 % (ref 0.0–3.0)
Eosinophils Absolute: 0.1 10*3/uL (ref 0.0–0.7)
Eosinophils Relative: 3.3 % (ref 0.0–5.0)
HCT: 39.8 % (ref 36.0–46.0)
Hemoglobin: 12.2 g/dL (ref 12.0–15.0)
Lymphocytes Relative: 50 % — ABNORMAL HIGH (ref 12.0–46.0)
Lymphs Abs: 2.1 10*3/uL (ref 0.7–4.0)
MCHC: 30.7 g/dL (ref 30.0–36.0)
MCV: 77.2 fL — ABNORMAL LOW (ref 78.0–100.0)
Monocytes Absolute: 0.3 10*3/uL (ref 0.1–1.0)
Monocytes Relative: 7 % (ref 3.0–12.0)
Neutro Abs: 1.6 10*3/uL (ref 1.4–7.7)
Neutrophils Relative %: 38.9 % — ABNORMAL LOW (ref 43.0–77.0)
Platelets: 232 10*3/uL (ref 150.0–400.0)
RBC: 5.15 Mil/uL — ABNORMAL HIGH (ref 3.87–5.11)
RDW: 14.1 % (ref 11.5–15.5)
WBC: 4.1 10*3/uL (ref 4.0–10.5)

## 2023-04-04 LAB — COMPREHENSIVE METABOLIC PANEL
ALT: 9 U/L (ref 0–35)
AST: 16 U/L (ref 0–37)
Albumin: 4.1 g/dL (ref 3.5–5.2)
Alkaline Phosphatase: 44 U/L (ref 39–117)
BUN: 11 mg/dL (ref 6–23)
CO2: 28 meq/L (ref 19–32)
Calcium: 9.2 mg/dL (ref 8.4–10.5)
Chloride: 106 meq/L (ref 96–112)
Creatinine, Ser: 0.83 mg/dL (ref 0.40–1.20)
GFR: 82.74 mL/min (ref >60.00)
Glucose, Bld: 90 mg/dL (ref 70–99)
Potassium: 4.2 meq/L (ref 3.5–5.1)
Sodium: 139 meq/L (ref 135–145)
Total Bilirubin: 0.4 mg/dL (ref 0.2–1.2)
Total Protein: 6.6 g/dL (ref 6.0–8.3)

## 2023-04-04 NOTE — Patient Instructions (Addendum)
Your provider has requested that you go to the basement level for lab work before leaving today. Press "B" on the elevator. The lab is located at the first door on the left as you exit the elevator.  Apply a small amount of Desitin inside the anal opening and to the external anal area three times daily as needed for anal or hemorrhoidal irritation/bleeding.   Mix a small amount of Hydrocortisone 2.5% cream with Desitin with instructions as noted above.   Sitz bath, soak in warm water for 10 minutes twice daily x 1 week   Take Miralax 1 capful mixed in 8 ounces of water at bed time for constipation as tolerated.  Contact Colleen NP in 2 weeks with an update.  Due to recent changes in healthcare laws, you may see the results of your imaging and laboratory studies on MyChart before your provider has had a chance to review them.  We understand that in some cases there may be results that are confusing or concerning to you. Not all laboratory results come back in the same time frame and the provider may be waiting for multiple results in order to interpret others.  Please give [place] 48 hours in order for your provider to thoroughly review all the results before contacting the office for clarification of your results.   Thank you for trusting me with your gastrointestinal care!   Alcide Evener, CRNP

## 2023-04-04 NOTE — Progress Notes (Signed)
04/04/2023 Kelly Simmons [id number] May 18, 1976   CHIEF COMPLAINT: Hemorrhoids  HISTORY OF PRESENT ILLNESS:  Kelly Simmons is a 47 year old female with a past medical history of anxiety, depression, asthma, hypertension, iron deficiency anemia, and rectal carcinoid 2012. Past C section and hysterectomy 2017. She is known by Dr. Barron Alvine. She presents today for further evaluation regarding a small lump to the anal area which she first noticed one week ago while in the shower. The anal lump is nontender and does not feel like hemorrhoids like she experienced in the past. She deneis having any upper or lower abdominal pain. She is passing a normal brown formed stools daily without straining. No weight loss. She has hot flashes which she contributes to being menopausal which is well controlled since starting Effexor. She underwent a colonoscopy by Dr. Barron Alvine 04/29/2020 which showed a post polypectomy scare in the distal rectum which was consistent with prior rectal carcinoid and diverticulosis to the ascending colon. Biopsies of the rectal scare showed colonic mucosa with reactive changes without evidence of recurrent carcinoid. Father with history of colon polyps. No known family history of carcinoid syndrome or colorectal cancer. She takes Mobic 15mg  daily for back, hip, shoulder and leg pain.      Latest Ref Rng & Units 03/17/2022   12:00 AM 09/25/2020   10:28 AM 09/19/2019    2:48 PM  CBC  WBC 3.8 - 10.8 Thousand/uL 3.7  3.5  4.8   Hemoglobin 11.7 - 15.5 g/dL 42.5  95.6  38.7   Hematocrit 35.0 - 45.0 % 40.6  39.9  39.8   Platelets 140 - 400 Thousand/uL 289  357  382        Latest Ref Rng & Units 03/17/2022   12:00 AM 09/25/2020   10:28 AM 09/19/2019    2:48 PM  CMP  Glucose 65 - 99 mg/dL 89  93  86   BUN 7 - 25 mg/dL 14  8  14    Creatinine 0.50 - 0.99 mg/dL 5.64  3.32  9.51   Sodium 135 - 146 mmol/L 139  139  137   Potassium 3.5 - 5.3 mmol/L 4.1  4.0  3.6   Chloride 98 -  110 mmol/L 103  103  99   CO2 20 - 32 mmol/L 28  27  30    Calcium 8.6 - 10.2 mg/dL 9.8  88.4  9.7   Total Protein 6.1 - 8.1 g/dL 7.4  7.3  7.1   Total Bilirubin 0.2 - 1.2 mg/dL 0.6  0.5  0.3   AST 10 - 35 U/L 13  13  14    ALT 6 - 29 U/L 11  8  10      Colonoscopy 04/29/2020: - Post-polypectomy scar in the distal rectum consistent with prior history of rectal carcinoid. Biopsied.  - Diverticulosis in the ascending colon. -  The distal rectum and anal verge are normal on retroflexion view.  - The examined portion of the ileum was normal.  -10 year recall colonoscopy  Surgical [P], colon, rectum scar bx - COLONIC MUCOSA WITH REACTIVE CHANGES. NO CARCINOID IDENTIFIED.  Colonoscopy (09/2010, [facility]): 5 mm rectal carcinoid.   Flexible sigmoidoscopy (10/2010, Dr. Margaretha Glassing, [facility]): 2 mm nearly healed ulcer in the distal rectum located 2 cm from the anal verge which may represent previous biopsy site. Entire area removed with biopsy forceps. Pathology revealed small focus of carcinoid tumor. Recommended repeat flexible sigmoidoscopy in 6 months.  Flexible sigmoidoscopy (04/2011, Dr. Margaretha Glassing, [facility]): Scar in distal rectum 2 cm from dentate line. No residual tumor. Multiple biopsies obtained and negative for carcinoid.   Colonoscopy (04/2012, Dr. Margaretha Glassing, [facility]): Small scar in distal rectum without residual carcinoid. Otherwise normal colon. Repeat colonoscopy at age 24 for routine screening.  Past Medical History:  Diagnosis Date   Anxiety    Asthma    Depression    Herpes    High blood pressure    Rectal carcinoma [facility])    Past Surgical History:  Procedure Laterality Date   CESAREAN SECTION  2009   COLONOSCOPY  2013   6 months later from Sigmoidoscopy [facility]   LAPAROSCOPIC VAGINAL HYSTERECTOMY  06/15/2016   Ovaries still in place   SIGMOIDOSCOPY  2013   [facility]   Social History: She smoked cigarettes during college years, quit 2002. Rare alcohol  use. Past canibis/CBD for pain and anxiety.   Family History: family history includes Alzheimer's disease in her maternal grandmother and paternal grandmother; Breast cancer (age of onset: 43) in her mother; Colon polyps in her father; Diabetes in her father; Heart attack in her paternal grandfather; High blood pressure in her brother, father, mother, and sister; Kidney disease in her father; Prostate cancer in her maternal grandfather; Stroke in her father.  No Known Allergies    Outpatient Encounter Medications as of 04/04/2023  Medication Sig   albuterol (VENTOLIN HFA) 108 (90 Base) MCG/ACT inhaler Inhale 1-2 puffs into the lungs every 4 (four) hours as needed for wheezing or shortness of breath.   BIOTIN PO Take by mouth.   Cholecalciferol 100 MCG (4000 UT) CAPS Take by mouth.   hydrochlorothiazide (HYDRODIURIL) 25 MG tablet TAKE 1 TABLET (25 MG TOTAL) BY MOUTH DAILY. NEEDS APPOINTMENT FOR FURTHER REFILLS.   meloxicam (MOBIC) 15 MG tablet Take 1 tablet (15 mg total) by mouth daily.   promethazine-dextromethorphan (PROMETHAZINE-DM) 6.25-15 MG/5ML syrup Take 5 mLs by mouth 4 (four) times daily as needed for cough.   valACYclovir (VALTREX) 1000 MG tablet Take 2 tablets by mouth twice daily for one day as need for cold sore   venlafaxine XR (EFFEXOR-XR) 150 MG 24 hr capsule Take 1 capsule (150 mg total) by mouth daily with breakfast.   No facility-administered encounter medications on file as of 04/04/2023.   REVIEW OF SYSTEMS:  Gen: Denies fever, sweats or chills. No weight loss.  CV: Denies chest pain, palpitations or edema. Resp: Denies cough, shortness of breath of hemoptysis.  GI:See HPI. GU: Denies urinary burning, blood in urine, increased urinary frequency or incontinence. MS: + Arthritis and back pain.  Derm: Denies rash, itchiness, skin lesions or unhealing ulcers. Psych: Denies depression, anxiety, memory loss or confusion. Heme: Denies bruising, easy bleeding. Neuro:  Denies  headaches, dizziness or paresthesias. Endo:  Denies any problems with DM, thyroid or adrenal function.  PHYSICAL EXAM: BP 136/70 (BP Location: Left Arm, Patient Position: Sitting, Cuff Size: Normal)   Pulse 74   Ht 5' 1.5" (1.562 m)   Wt 164 lb (74.4 kg)   BMI 30.49 kg/m   General: 47 year old female in no acute distress. Head: Normocephalic and atraumatic. Eyes:  Sclerae non-icteric, conjunctive pink. Ears: Normal auditory acuity. Mouth: Dentition intact. No ulcers or lesions.  Neck: Supple, no lymphadenopathy or thyromegaly.  Lungs: Clear bilaterally to auscultation without wheezes, crackles or rhonchi. Heart: Regular rate and rhythm. No murmur, rub or gallop appreciated.  Abdomen: Soft, nontender, nondistended. No masses.  No hepatosplenomegaly. Normoactive bowel sounds x 4 quadrants.  Rectal: Small external hemorrhoids. Pea size firm nontender lesion 1 cm inside the left anal verge, anoscopy showed hemorrhoidal tissue to this area. No blood. Kelly Simmons CMA present during exam. No blood or exudate.  Musculoskeletal: Symmetrical with no gross deformities. Skin: Warm and dry. No rash or lesions on visible extremities. Extremities: No edema. Neurological: Alert oriented x 4, no focal deficits.  Psychological:  Alert and cooperative. Normal mood and affect.  ASSESSMENT AND PLAN:  47 year old female with a history of rectal carcinoid in 2012 presents with a new nontender left anal nodule palpated to are of anal hemorrhoids. Colonoscopy 04/2020 showed evidence of prior rectal carcinoid excision scar with negative biopsies, no polyps.  -Sitz bath with warm water x 10 minutes bid x 1 weeks  -Apply a small amount of Hydrocortisone 2.5% cream with Desitin inside the anal opening and to the external anal area three times dailyfor anal or hemorrhoidal irritation/bleeding x 2 weeks -Take Miralax 1 capful mixed in 8 ounces of water at bed time for constipation as tolerated -Patient to contact  Yasamin Karel NP in 2 week with an update  -Refer to colorectal surgery for biopsy if left anal nodule persists  -Patient wishes to undergo a colonoscopy prior to her 10 year recall date     CC:  Christen Butter, NP

## 2023-04-07 ENCOUNTER — Other Ambulatory Visit: Payer: Self-pay | Admitting: Medical-Surgical

## 2023-04-13 ENCOUNTER — Ambulatory Visit (INDEPENDENT_AMBULATORY_CARE_PROVIDER_SITE_OTHER): Payer: BC Managed Care – PPO | Admitting: Medical-Surgical

## 2023-04-13 VITALS — BP 157/96 | HR 60 | Resp 20 | Ht 61.5 in | Wt 162.0 lb

## 2023-04-13 DIAGNOSIS — Z0000 Encounter for general adult medical examination without abnormal findings: Secondary | ICD-10-CM | POA: Diagnosis not present

## 2023-04-13 DIAGNOSIS — E785 Hyperlipidemia, unspecified: Secondary | ICD-10-CM | POA: Diagnosis not present

## 2023-04-13 DIAGNOSIS — F419 Anxiety disorder, unspecified: Secondary | ICD-10-CM | POA: Diagnosis not present

## 2023-04-13 DIAGNOSIS — I10 Essential (primary) hypertension: Secondary | ICD-10-CM | POA: Diagnosis not present

## 2023-04-13 DIAGNOSIS — F32A Depression, unspecified: Secondary | ICD-10-CM

## 2023-04-13 DIAGNOSIS — D3A026 Benign carcinoid tumor of the rectum: Secondary | ICD-10-CM

## 2023-04-13 DIAGNOSIS — U071 COVID-19: Secondary | ICD-10-CM

## 2023-04-13 MED ORDER — VALACYCLOVIR HCL 1 G PO TABS: ORAL_TABLET | ORAL | 3 refills | Status: AC

## 2023-04-13 MED ORDER — VALSARTAN 40 MG PO TABS: 40.0000 mg | ORAL_TABLET | Freq: Every day | ORAL | 1 refills | Status: DC

## 2023-04-13 MED ORDER — HYDROCHLOROTHIAZIDE 25 MG PO TABS: 25.0000 mg | ORAL_TABLET | Freq: Every day | ORAL | 3 refills | Status: DC

## 2023-04-13 MED ORDER — ALBUTEROL SULFATE HFA 108 (90 BASE) MCG/ACT IN AERS: 1.0000 | INHALATION_SPRAY | RESPIRATORY_TRACT | 5 refills | Status: DC | PRN

## 2023-04-13 MED ORDER — MELOXICAM 15 MG PO TABS: 15.0000 mg | ORAL_TABLET | Freq: Every day | ORAL | 3 refills | Status: DC

## 2023-04-13 NOTE — Patient Instructions (Signed)
Preventive Care 40-49 Years Old, Female Preventive care refers to lifestyle choices and visits with your health care provider that can promote health and wellness. Preventive care visits are also called wellness exams. What can I expect for my preventive care visit? Counseling Your health care provider may ask you questions about your: Medical history, including: Past medical problems. Family medical history. Pregnancy history. Current health, including: Menstrual cycle. Method of birth control. Emotional well-being. Home life and relationship well-being. Sexual activity and sexual health. Lifestyle, including: Alcohol, nicotine or tobacco, and drug use. Access to firearms. Diet, exercise, and sleep habits. Work and work environment. Sunscreen use. Safety issues such as seatbelt and bike helmet use. Physical exam Your health care provider will check your: Height and weight. These may be used to calculate your BMI (body mass index). BMI is a measurement that tells if you are at a healthy weight. Waist circumference. This measures the distance around your waistline. This measurement also tells if you are at a healthy weight and may help predict your risk of certain diseases, such as type 2 diabetes and high blood pressure. Heart rate and blood pressure. Body temperature. Skin for abnormal spots. What immunizations do I need?  Vaccines are usually given at various ages, according to a schedule. Your health care provider will recommend vaccines for you based on your age, medical history, and lifestyle or other factors, such as travel or where you work. What tests do I need? Screening Your health care provider may recommend screening tests for certain conditions. This may include: Lipid and cholesterol levels. Diabetes screening. This is done by checking your blood sugar (glucose) after you have not eaten for a while (fasting). Pelvic exam and Pap test. Hepatitis B test. Hepatitis C  test. HIV (human immunodeficiency virus) test. STI (sexually transmitted infection) testing, if you are at risk. Lung cancer screening. Colorectal cancer screening. Mammogram. Talk with your health care provider about when you should start having regular mammograms. This may depend on whether you have a family history of breast cancer. BRCA-related cancer screening. This may be done if you have a family history of breast, ovarian, tubal, or peritoneal cancers. Bone density scan. This is done to screen for osteoporosis. Talk with your health care provider about your test results, treatment options, and if necessary, the need for more tests. Follow these instructions at home: Eating and drinking  Eat a diet that includes fresh fruits and vegetables, whole grains, lean protein, and low-fat dairy products. Take vitamin and mineral supplements as recommended by your health care provider. Do not drink alcohol if: Your health care provider tells you not to drink. You are pregnant, may be pregnant, or are planning to become pregnant. If you drink alcohol: Limit how much you have to 0-1 drink a day. Know how much alcohol is in your drink. In the [place] one drink equals one 12 oz bottle of beer (355 mL), one 5 oz glass of wine (148 mL), or one 1 oz glass of hard liquor (44 mL). Lifestyle Brush your teeth every morning and night with fluoride toothpaste. Floss one time each day. Exercise for at least 30 minutes 5 or more days each week. Do not use any products that contain nicotine or tobacco. These products include cigarettes, chewing tobacco, and vaping devices, such as e-cigarettes. If you need help quitting, ask your health care provider. Do not use drugs. If you are sexually active, practice safe sex. Use a condom or other form of protection to   prevent STIs. If you do not wish to become pregnant, use a form of birth control. If you plan to become pregnant, see your health care provider for a  prepregnancy visit. Take aspirin only as told by your health care provider. Make sure that you understand how much to take and what form to take. Work with your health care provider to find out whether it is safe and beneficial for you to take aspirin daily. Find healthy ways to manage stress, such as: Meditation, yoga, or listening to music. Journaling. Talking to a trusted person. Spending time with friends and family. Minimize exposure to UV radiation to reduce your risk of skin cancer. Safety Always wear your seat belt while driving or riding in a vehicle. Do not drive: If you have been drinking alcohol. Do not ride with someone who has been drinking. When you are tired or distracted. While texting. If you have been using any mind-altering substances or drugs. Wear a helmet and other protective equipment during sports activities. If you have firearms in your house, make sure you follow all gun safety procedures. Seek help if you have been physically or sexually abused. What's next? Visit your health care provider once a year for an annual wellness visit. Ask your health care provider how often you should have your eyes and teeth checked. Stay up to date on all vaccines. This information is not intended to replace advice given to you by your health care provider. Make sure you discuss any questions you have with your health care provider. Document Revised: 12/10/2020 Document Reviewed: 12/10/2020 Elsevier Patient Education  2024 [employer]  

## 2023-04-13 NOTE — Progress Notes (Signed)
Complete physical exam  Patient: Kelly Simmons   DOB: 1976-04-26   47 y.o. Female  MRN: [id number]  Subjective:    Chief Complaint  Patient presents with   Annual Exam   Kelly Simmons is a 47 y.o. female who presents today for a complete physical exam. She reports consuming a  pescatarian  diet.  Walking for exercise.  She generally feels well. She reports sleeping well. She does not have additional problems to discuss today.    Most recent fall risk assessment:    04/13/2023    9:55 AM  Fall Risk   Falls in the past year? 0  Number falls in past yr: 0  Injury with Fall? 0  Risk for fall due to : No Fall Risks  Follow up Falls evaluation completed     Most recent depression screenings:    04/13/2023    9:55 AM 03/17/2022    2:07 PM  PHQ 2/9 Scores  PHQ - 2 Score 0 4  PHQ- 9 Score  12    Vision:Within last year, Dental: No current dental problems and Receives regular dental care, and STD: The patient reports a past history of: herpes    Patient Care Team: Christen Butter, NP as PCP - General (Nurse Practitioner)   Outpatient Medications Prior to Visit  Medication Sig   BIOTIN PO Take by mouth.   Cholecalciferol 100 MCG (4000 UT) CAPS Take by mouth.   venlafaxine XR (EFFEXOR-XR) 150 MG 24 hr capsule Take 1 capsule (150 mg total) by mouth daily with breakfast.   [DISCONTINUED] albuterol (VENTOLIN HFA) 108 (90 Base) MCG/ACT inhaler Inhale 1-2 puffs into the lungs every 4 (four) hours as needed for wheezing or shortness of breath.   [DISCONTINUED] hydrochlorothiazide (HYDRODIURIL) 25 MG tablet Take 1 tablet (25 mg total) by mouth daily.   [DISCONTINUED] meloxicam (MOBIC) 15 MG tablet Take 1 tablet (15 mg total) by mouth daily.   [DISCONTINUED] valACYclovir (VALTREX) 1000 MG tablet Take 2 tablets by mouth twice daily for one day as need for cold sore   [DISCONTINUED] promethazine-dextromethorphan (PROMETHAZINE-DM) 6.25-15 MG/5ML syrup Take 5 mLs by mouth 4 (four)  times daily as needed for cough.   No facility-administered medications prior to visit.    Review of Systems  Constitutional:  Negative for chills, fever, malaise/fatigue and weight loss.  HENT:  Negative for congestion, ear pain, hearing loss, sinus pain and sore throat.   Eyes:  Negative for blurred vision, photophobia and pain.  Respiratory:  Negative for cough, shortness of breath and wheezing.   Cardiovascular:  Negative for chest pain, palpitations and leg swelling.  Gastrointestinal:  Negative for abdominal pain, constipation, diarrhea, heartburn, nausea and vomiting.  Genitourinary:  Negative for dysuria, frequency and urgency.  Musculoskeletal:  Negative for falls and neck pain.  Skin:  Negative for itching and rash.  Neurological:  Negative for dizziness, weakness and headaches.  Endo/Heme/Allergies:  Negative for polydipsia. Does not bruise/bleed easily.  Psychiatric/Behavioral:  Negative for depression, substance abuse and suicidal ideas. The patient is not nervous/anxious and does not have insomnia.      Objective:    BP (!) 148/93 (BP Location: Right Arm, Cuff Size: Normal)   Pulse 60   Resp 20   Ht 5' 1.5" (1.562 m)   Wt 162 lb (73.5 kg)   SpO2 98%   BMI 30.11 kg/m    Physical Exam Vitals reviewed.  Constitutional:      General: She is not in  acute distress.    Appearance: Normal appearance. She is not ill-appearing.  HENT:     Head: Normocephalic and atraumatic.     Right Ear: Tympanic membrane, ear canal and external ear normal. There is no impacted cerumen.     Left Ear: Tympanic membrane, ear canal and external ear normal. There is no impacted cerumen.     Nose: Nose normal. No congestion or rhinorrhea.     Mouth/Throat:     Mouth: Mucous membranes are moist.     Pharynx: No oropharyngeal exudate or posterior oropharyngeal erythema.  Eyes:     General: No scleral icterus.       Right eye: No discharge.        Left eye: No discharge.     Extraocular  Movements: Extraocular movements intact.     Conjunctiva/sclera: Conjunctivae normal.     Pupils: Pupils are equal, round, and reactive to light.  Neck:     Thyroid: No thyromegaly.     Vascular: No carotid bruit or JVD.     Trachea: Trachea normal.  Cardiovascular:     Rate and Rhythm: Normal rate and regular rhythm.     Pulses: Normal pulses.     Heart sounds: Normal heart sounds. No murmur heard.    No friction rub. No gallop.  Pulmonary:     Effort: Pulmonary effort is normal. No respiratory distress.     Breath sounds: Normal breath sounds. No wheezing.  Abdominal:     General: Bowel sounds are normal. There is no distension.     Palpations: Abdomen is soft.     Tenderness: There is no abdominal tenderness. There is no guarding.  Musculoskeletal:        General: Normal range of motion.     Cervical back: Normal range of motion and neck supple.  Lymphadenopathy:     Cervical: No cervical adenopathy.  Skin:    General: Skin is warm and dry.  Neurological:     Mental Status: She is alert and oriented to person, place, and time.     Cranial Nerves: No cranial nerve deficit.  Psychiatric:        Mood and Affect: Mood normal.        Behavior: Behavior normal.        Thought Content: Thought content normal.        Judgment: Judgment normal.      No results found for any visits on 04/13/23.     Assessment & Plan:    Routine Health Maintenance and Physical Exam  Immunization History  Administered Date(s) Administered   Influenza, Seasonal, Injecte, Preservative Fre 03/17/2023   Influenza,inj,Quad PF,6+ Mos 05/12/2016, 04/21/2018, 03/28/2019, 05/27/2021, 03/17/2022   Influenza-Unspecified 04/10/2012, 04/04/2013, 04/29/2014, 05/11/2015, 04/17/2017, 04/21/2018   PFIZER(Purple Top)SARS-COV-2 Vaccination 09/12/2019, 10/03/2019, 05/06/2020   Pfizer Covid-19 Vaccine Bivalent Booster 49yrs & up 06/18/2021   Tdap 12/01/2008, 12/11/2008, 07/12/2018    Health Maintenance   Topic Date Due   COVID-19 Vaccine (5 - 2023-24 season) 04/29/2023 (Originally 02/27/2023)   DTaP/Tdap/Td (4 - Td or Tdap) 07/12/2028   Colonoscopy  04/29/2030   INFLUENZA VACCINE  Completed   Hepatitis C Screening  Completed   HIV Screening  Completed   HPV VACCINES  Aged Out    Discussed health benefits of physical activity, and encouraged her to engage in regular exercise appropriate for her age and condition.  1. Annual physical exam Recent CMP and CBC reviewed.  Up-to-date on preventative care.  Wellness information provided  with AVS.  2. Essential hypertension Blood pressure elevated on arrival at 148/93.  It has been running high lately and she is interested in adjusting her medication.  She does note that she has been under more stress and this is likely contributing.  She is walking for exercise but admits that she can do more physical activity.  Continues to add salt to her foods.  Taking hydrochlorothiazide 25 mg daily which is well-tolerated without side effects.  Recheck of blood pressure no better.  Adding valsartan 40 mg daily.  Monitor blood pressure at home with a goal of 130/80 or less.  Return in 2 weeks for nurse visit for blood pressure check.  3. Anxiety and depression Managed by OB/GYN.  Doing well on Effexor 150 mg daily.  4. Hyperlipidemia, unspecified hyperlipidemia type Not currently on any medications for cholesterol management.  Checking lipids today. - Lipid panel  5. COVID-19 virus infection COVID-19 virus infection has resolved however she finds that using albuterol on an as-needed basis helps when allergies are flaring.  Refilling albuterol inhaler today. - albuterol (VENTOLIN HFA) 108 (90 Base) MCG/ACT inhaler; Inhale 1-2 puffs into the lungs every 4 (four) hours as needed for wheezing or shortness of breath.  Dispense: 18 g; Refill: 5  6. Carcinoid tumor of rectum, unspecified whether malignant Reports this occurred in 2014 and she has undergone  treatment and management of this.  She had a colonoscopy 3 to 4 years ago and was cleared.  Followed up with GI recently who will continue to monitor.  Return in about 2 weeks (around 04/27/2023) for nurse visit for BP check.   Christen Butter, NP

## 2023-04-14 LAB — LIPID PANEL
Chol/HDL Ratio: 2.3 {ratio} (ref 0.0–4.4)
Cholesterol, Total: 235 mg/dL — ABNORMAL HIGH (ref 100–199)
HDL: 103 mg/dL (ref >39)
LDL Chol Calc (NIH): 121 mg/dL — ABNORMAL HIGH (ref 0–99)
Triglycerides: 66 mg/dL (ref 0–149)
VLDL Cholesterol Cal: 11 mg/dL (ref 5–40)

## 2023-04-20 NOTE — Progress Notes (Signed)
Kelly Simmons, I attempted to call the patient at this time and left a detailed message on the provided cell phone, asking the patient to call me with a symptom update and if her anal nodule persists and has not completely resolved , she will require further evaluation including referral to colorectal surgery and to schedule a colonoscopy.  If the patient returns my call when I am in inpatient care, you can contact my CMA and pull me out of the room to take the call.  Thank you

## 2023-04-20 NOTE — Progress Notes (Signed)
Agree with the assessment and plan as outlined by Alcide Evener, NP.   Agree with plan for further evaluation if nodule persists. Can do referral to Colorectal surgery for EUA with biopsy as outlined. Alternatively, perfectly reasonable to proceed with repeat colonoscopy now to re-evaluate and biopsy as appropriate, with consideration for rectal EUS depending on findings. If this is her preference, ok to set up for colonoscopy with me to re-evaluate given her history of rectal carcinoid.    Doristine Locks, DO, [facility]

## 2023-04-21 ENCOUNTER — Telehealth: Payer: Self-pay | Admitting: Nurse Practitioner

## 2023-04-21 NOTE — Telephone Encounter (Signed)
Refer to office visit 04/04/2023 and Dr. Frankey Shown addendum as follows: Agree with plan for further evaluation if nodule persists. Can do referral to Colorectal surgery for EUA with biopsy as outlined. Alternatively, perfectly reasonable to proceed with repeat colonoscopy now to re-evaluate and biopsy as appropriate, with consideration for rectal EUS depending on findings. If this is her preference, ok to set up for colonoscopy with me to re-evaluate given her history of rectal carcinoid.   I called the patient 04/20/2023 to obtain an update did not reach her directly.  I left a message for her to call me back.  She called me this morning. She continues to have an anal nodule which has not improved or worsened since her office visit therefore I will send patient to colorectal surgery for rectal endoscopic ultrasound/biopsy and further evaluation of the anal nodule with history of rectal carcinoid and will schedule her for colonoscopy with Dr. Barron Alvine.  She agreed with this plan.  Kaira, please enter colorectal surgery referral as noted above and schedule her for a diagnostic colonoscopy with Dr. Barron Alvine.  Thank you

## 2023-04-21 NOTE — Telephone Encounter (Signed)
Left message for patient to call back. [facility] referral faxed.

## 2023-04-22 ENCOUNTER — Other Ambulatory Visit: Payer: Self-pay

## 2023-04-22 DIAGNOSIS — Z85048 Personal history of other malignant neoplasm of rectum, rectosigmoid junction, and anus: Secondary | ICD-10-CM

## 2023-04-22 DIAGNOSIS — K6289 Other specified diseases of anus and rectum: Secondary | ICD-10-CM

## 2023-04-22 MED ORDER — CLENPIQ 10-3.5-12 MG-GM -GM/160ML PO SOLN: 1.0000 | ORAL | 0 refills | Status: DC

## 2023-04-22 NOTE — Telephone Encounter (Signed)
Scheduled colon with patient for 05/30/23 at 4:00 pm in the [facility] with Dr. Barron Alvine. Amb ref placed & prep sent to pharmacy. Instructions sent to mychart & advised she call back with any questions. She's aware referral has been faxed to [facility] & has been advised to call in two weeks if she has not heard from their office for an appointment. Pt verbalized all understanding.

## 2023-04-28 ENCOUNTER — Ambulatory Visit: Payer: BC Managed Care – PPO | Admitting: Medical-Surgical

## 2023-04-28 VITALS — BP 139/85 | HR 72

## 2023-04-28 DIAGNOSIS — I10 Essential (primary) hypertension: Secondary | ICD-10-CM

## 2023-04-28 NOTE — Progress Notes (Signed)
Medical screening examination/treatment was performed by qualified clinical staff member and as supervising provider I was immediately available for consultation/collaboration. I have reviewed documentation and agree with assessment and plan. ° °Opie Maclaughlin L. Kreston Ahrendt, DNP, APRN, FNP-BC °[facility] °Primary Care and Sports Medicine ° °

## 2023-04-28 NOTE — Progress Notes (Signed)
   Established Patient Office Visit  Subjective   Patient ID: Kelly Simmons, female    DOB: Mar 18, 1976  Age: 47 y.o. MRN: [id number]  Chief Complaint  Patient presents with   Hypertension    HPI  Kelly Simmons is here for blood pressure check. She did not start the Valsartan. She has been working on exercise and eating healthy. Denies chest pain, shortness of breath or dizziness.   ROS    Objective:     BP 139/85   Pulse 72   SpO2 98%    Physical Exam   No results found for any visits on 04/28/23.    The ASCVD Risk score (Arnett DK, et al., 2019) failed to calculate for the following reasons:   The valid HDL cholesterol range is 20 to 100 mg/dL    Assessment & Plan:  Hypertension - Patient doesn't want to start the Valsartan. She will follow up in 3 months with Joy.   Problem List Items Addressed This Visit       Unprioritized   Essential hypertension - Primary    Return in about 3 months (around 07/29/2023) for HTN with Joy. Earna Coder, Janalyn Harder, CMA

## 2023-05-11 ENCOUNTER — Encounter: Payer: Self-pay | Admitting: Gastroenterology

## 2023-05-27 ENCOUNTER — Other Ambulatory Visit: Payer: Self-pay | Admitting: Nurse Practitioner

## 2023-05-27 ENCOUNTER — Telehealth: Payer: Self-pay | Admitting: Nurse Practitioner

## 2023-05-27 MED ORDER — MOVIPREP 100 G PO SOLR: 1.0000 | ORAL | 0 refills | Status: DC

## 2023-05-27 NOTE — Telephone Encounter (Signed)
Patient called the answering service.  Unable to get prep Clenpiq at her pharmacy. Also tried other pharmacies and it has to be ordered. Her colonoscopy in on Monday. Needs different prep called in .   I will call in Moviprep to [facility] [employer] in [place].

## 2023-05-28 ENCOUNTER — Telehealth: Payer: Self-pay | Admitting: Gastroenterology

## 2023-05-28 MED ORDER — NA SULFATE-K SULFATE-MG SULF 17.5-3.13-1.6 GM/177ML PO SOLN
1.0000 | Freq: Once | ORAL | 0 refills | Status: AC
Start: 2023-05-28 — End: 2023-05-28

## 2023-05-28 NOTE — Telephone Encounter (Signed)
Patient called about prep concerns.  Says that Moviprep is over $100.  Pharmacy said that suprep would be covered.  Suprep sent to the pharmacy that she requested.  She will following the same instructions that are on the Clenpiq instructions as far as timing of drinking, etc.

## 2023-05-30 ENCOUNTER — Ambulatory Visit: Payer: BC Managed Care – PPO | Admitting: Gastroenterology

## 2023-05-30 ENCOUNTER — Encounter: Payer: Self-pay | Admitting: Gastroenterology

## 2023-05-30 VITALS — BP 135/85 | HR 60 | Temp 98.1°F | Resp 19 | Ht 61.0 in | Wt 164.0 lb

## 2023-05-30 DIAGNOSIS — Z85048 Personal history of other malignant neoplasm of rectum, rectosigmoid junction, and anus: Secondary | ICD-10-CM | POA: Diagnosis not present

## 2023-05-30 DIAGNOSIS — K5730 Diverticulosis of large intestine without perforation or abscess without bleeding: Secondary | ICD-10-CM

## 2023-05-30 DIAGNOSIS — Z1211 Encounter for screening for malignant neoplasm of colon: Secondary | ICD-10-CM

## 2023-05-30 DIAGNOSIS — D3A026 Benign carcinoid tumor of the rectum: Secondary | ICD-10-CM

## 2023-05-30 MED ORDER — SODIUM CHLORIDE 0.9 % IV SOLN: 500.0000 mL | Freq: Once | INTRAVENOUS | Status: DC

## 2023-05-30 NOTE — Progress Notes (Signed)
Report to PACU, RN, vss, BBS= Clear.  

## 2023-05-30 NOTE — Patient Instructions (Signed)
   Handout on diverticulosis given to you today  Await biopsy results from rectum   Continue previous diet & medications    Repeat Colonoscopy in 10 years for screening purposes     YOU HAD AN ENDOSCOPIC PROCEDURE TODAY AT THE [facility]:   Refer to the procedure report that was given to you for any specific questions about what was found during the examination.  If the procedure report does not answer your questions, please call your gastroenterologist to clarify.  If you requested that your care partner not be given the details of your procedure findings, then the procedure report has been included in a sealed envelope for you to review at your convenience later.  YOU SHOULD EXPECT: Some feelings of bloating in the abdomen. Passage of more gas than usual.  Walking can help get rid of the air that was put into your GI tract during the procedure and reduce the bloating. If you had a lower endoscopy (such as a colonoscopy or flexible sigmoidoscopy) you may notice spotting of blood in your stool or on the toilet paper. If you underwent a bowel prep for your procedure, you may not have a normal bowel movement for a few days.  Please Note:  You might notice some irritation and congestion in your nose or some drainage.  This is from the oxygen used during your procedure.  There is no need for concern and it should clear up in a day or so.  SYMPTOMS TO REPORT IMMEDIATELY:  Following lower endoscopy (colonoscopy or flexible sigmoidoscopy):  Excessive amounts of blood in the stool  Significant tenderness or worsening of abdominal pains  Swelling of the abdomen that is new, acute  Fever of 100F or higher   For urgent or emergent issues, a gastroenterologist can be reached at any hour by calling [phone]. Do not use MyChart messaging for urgent concerns.    DIET:  We do recommend a small meal at first, but then you may proceed to your regular diet.  Drink plenty of fluids  but you should avoid alcoholic beverages for 24 hours.  ACTIVITY:  You should plan to take it easy for the rest of today and you should NOT DRIVE or use heavy machinery until tomorrow (because of the sedation medicines used during the test).    FOLLOW UP: Our staff will call the number listed on your records the next business day following your procedure.  We will call around 7:15- 8:00 am to check on you and address any questions or concerns that you may have regarding the information given to you following your procedure. If we do not reach you, we will leave a message.     If any biopsies were taken you will be contacted by phone or by letter within the next 1-3 weeks.  Please call us at [phone] if you have not heard about the biopsies in 3 weeks.    SIGNATURES/CONFIDENTIALITY: You and/or your care partner have signed paperwork which will be entered into your electronic medical record.  These signatures attest to the fact that that the information above on your After Visit Summary has been reviewed and is understood.  Full responsibility of the confidentiality of this discharge information lies with you and/or your care-partner.

## 2023-05-30 NOTE — Progress Notes (Signed)
Called to room to assist during endoscopic procedure.  Patient ID and intended procedure confirmed with present staff. Received instructions for my participation in the procedure from the performing physician.  

## 2023-05-30 NOTE — Progress Notes (Signed)
GASTROENTEROLOGY PROCEDURE H&P NOTE   Primary Care Physician: Christen Butter, NP    Reason for Procedure:   Hemorrhoids, history of rectal carcinoid  Plan:    colonoscopy  Patient is appropriate for endoscopic procedure(s) in the ambulatory ([facility]) setting.  The nature of the procedure, as well as the risks, benefits, and alternatives were carefully and thoroughly reviewed with the patient. Ample time for discussion and questions allowed. The patient understood, was satisfied, and agreed to proceed.     HPI: Kelly Simmons is a 47 y.o. female who presents for colonoscopy for evaluation of palpable recta nodule with known history of rectal carcinoid and hemorrhoids.    Colonoscopy 04/29/2020: - Post-polypectomy scar in the distal rectum consistent with prior history of rectal carcinoid. Biopsied.  - Diverticulosis in the ascending colon. -  The distal rectum and anal verge are normal on retroflexion view.  - The examined portion of the ileum was normal.  -10 year recall colonoscopy  Surgical [P], colon, rectum scar bx - COLONIC MUCOSA WITH REACTIVE CHANGES. NO CARCINOID IDENTIFIED.   Colonoscopy (09/2010, [facility]): 5 mm rectal carcinoid.    Flexible sigmoidoscopy (10/2010, Dr. Margaretha Glassing, [facility]): 2 mm nearly healed ulcer in the distal rectum located 2 cm from the anal verge which may represent previous biopsy site. Entire area removed with biopsy forceps. Pathology revealed small focus of carcinoid tumor. Recommended repeat flexible sigmoidoscopy in 6 months.    Flexible sigmoidoscopy (04/2011, Dr. Margaretha Glassing, [facility]): Scar in distal rectum 2 cm from dentate line. No residual tumor. Multiple biopsies obtained and negative for carcinoid.    Colonoscopy (04/2012, Dr. Margaretha Glassing, [facility]): Small scar in distal rectum without residual carcinoid. Otherwise normal colon. Repeat colonoscopy at age 13 for routine screening.  Past Medical History:  Diagnosis Date   Anxiety     Asthma    Depression    Herpes    High blood pressure    Rectal carcinoma [facility])     Past Surgical History:  Procedure Laterality Date   CESAREAN SECTION  2009   COLONOSCOPY  2013   6 months later from Sigmoidoscopy [facility]   LAPAROSCOPIC VAGINAL HYSTERECTOMY  06/15/2016   Ovaries still in place   SIGMOIDOSCOPY  2013   [facility]    Prior to Admission medications   Medication Sig Start Date End Date Taking? Authorizing Provider  albuterol (VENTOLIN HFA) 108 (90 Base) MCG/ACT inhaler Inhale 1-2 puffs into the lungs every 4 (four) hours as needed for wheezing or shortness of breath. 04/13/23   Christen Butter, NP  BIOTIN PO Take by mouth.    [provider]  Cholecalciferol 100 MCG (4000 UT) CAPS Take by mouth. 07/04/17   [provider]  hydrochlorothiazide (HYDRODIURIL) 25 MG tablet Take 1 tablet (25 mg total) by mouth daily. 04/13/23   Christen Butter, NP  meloxicam (MOBIC) 15 MG tablet Take 1 tablet (15 mg total) by mouth daily. 04/13/23   Christen Butter, NP  MOVIPREP 100 g SOLR Take 1 kit (200 g total) by mouth as directed. On Sunday drink 1st part of prep at 6:00 pm followed by 32 ounces of water. On Monday at 11am drink 2nd part of prep followed by 16 ounces of water. You can continue to drink water until 1:00 pm ( 2 hours prior to your colonoscopy) 05/29/23   Meredith Pel, NP  valACYclovir (VALTREX) 1000 MG tablet Take 2 tablets by mouth twice daily for one day as need  for cold sore 04/13/23   Christen Butter, NP  valsartan (DIOVAN) 40 MG tablet Take 1 tablet (40 mg total) by mouth daily. Patient not taking: Reported on 04/28/2023 04/13/23   Christen Butter, NP  venlafaxine XR (EFFEXOR-XR) 150 MG 24 hr capsule Take 1 capsule (150 mg total) by mouth daily with breakfast. 03/17/23   Milas Hock, MD    Current Outpatient Medications  Medication Sig Dispense Refill   albuterol (VENTOLIN HFA) 108 (90 Base) MCG/ACT inhaler Inhale 1-2 puffs into the lungs every 4 (four)  hours as needed for wheezing or shortness of breath. 18 g 5   BIOTIN PO Take by mouth.     Cholecalciferol 100 MCG (4000 UT) CAPS Take by mouth.     hydrochlorothiazide (HYDRODIURIL) 25 MG tablet Take 1 tablet (25 mg total) by mouth daily. 90 tablet 3   meloxicam (MOBIC) 15 MG tablet Take 1 tablet (15 mg total) by mouth daily. 90 tablet 3   MOVIPREP 100 g SOLR Take 1 kit (200 g total) by mouth as directed. On Sunday drink 1st part of prep at 6:00 pm followed by 32 ounces of water. On Monday at 11am drink 2nd part of prep followed by 16 ounces of water. You can continue to drink water until 1:00 pm ( 2 hours prior to your colonoscopy) 1 each 0   valACYclovir (VALTREX) 1000 MG tablet Take 2 tablets by mouth twice daily for one day as need for cold sore 30 tablet 3   valsartan (DIOVAN) 40 MG tablet Take 1 tablet (40 mg total) by mouth daily. (Patient not taking: Reported on 04/28/2023) 30 tablet 1   venlafaxine XR (EFFEXOR-XR) 150 MG 24 hr capsule Take 1 capsule (150 mg total) by mouth daily with breakfast. 90 capsule 3   No current facility-administered medications for this visit.    Allergies as of 05/30/2023   (No Known Allergies)    Family History  Problem Relation Age of Onset   High blood pressure Mother    Breast cancer Mother 51       Inflammatory   High blood pressure Father    Diabetes Father    Stroke Father    Colon polyps Father    Kidney disease Father    High blood pressure Sister    High blood pressure Brother    Alzheimer's disease Maternal Grandmother    Prostate cancer Maternal Grandfather    Alzheimer's disease Paternal Grandmother    Heart attack Paternal Grandfather    Colon cancer Neg Hx    Esophageal cancer Neg Hx    Rectal cancer Neg Hx    Stomach cancer Neg Hx     Social History   Socioeconomic History   Marital status: Married    Spouse name: Not on file   Number of children: 2   Years of education: Not on file   Highest education level: Master's  degree (e.g., MA, MS, MEng, MEd, MSW, MBA)  Occupational History    Employer: [employer]  Tobacco Use   Smoking status: Former    Current packs/day: 0.50    Average packs/day: 0.5 packs/day for 4.0 years (2.0 ttl pk-yrs)    Types: Cigarettes   Smokeless tobacco: Never  Vaping Use   Vaping status: Never Used  Substance and Sexual Activity   Alcohol use: Yes    Comment: socially   Drug use: Yes    Types: Marijuana   Sexual activity: Yes    Partners: Male  Birth control/protection: Surgical  Other Topics Concern   Not on file  Social History Narrative   Not on file   Social Determinants of Health   Financial Resource Strain: High Risk (04/13/2023)   Overall Financial Resource Strain (CARDIA)    Difficulty of Paying Living Expenses: Hard  Food Insecurity: Food Insecurity Present (04/13/2023)   Hunger Vital Sign    Worried About Running Out of Food in the Last Year: Often true    Ran Out of Food in the Last Year: Often true  Transportation Needs: No Transportation Needs (04/13/2023)   PRAPARE - Administrator, Civil Service (Medical): No    Lack of Transportation (Non-Medical): No  Physical Activity: Insufficiently Active (04/13/2023)   Exercise Vital Sign    Days of Exercise per Week: 4 days    Minutes of Exercise per Session: 20 min  Stress: Stress Concern Present (04/13/2023)   [employer] of Occupational Health - Occupational Stress Questionnaire    Feeling of Stress : Very much  Social Connections: Socially Integrated (04/13/2023)   Social Connection and Isolation Panel [NHANES]    Frequency of Communication with Friends and Family: More than three times a week    Frequency of Social Gatherings with Friends and Family: More than three times a week    Attends Religious Services: More than 4 times per year    Active Member of [employer] or Organizations: Yes    Attends Banker Meetings: 1 to 4 times per year    Marital Status: Married   Catering manager Violence: Unknown (09/29/2021)   Received from [employer], [facility]   HITS    Physically Hurt: Not on file    Insult or Talk Down To: Not on file    Threaten Physical Harm: Not on file    Scream or Curse: Not on file    Physical Exam: Vital signs in last 24 hours: @There  were no vitals taken for this visit. GEN: NAD EYE: Sclerae anicteric ENT: MMM CV: Non-tachycardic Pulm: CTA b/l GI: Soft, NT/ND NEURO:  Alert & Oriented x 3   Doristine Locks, DO  Gastroenterology   05/30/2023 8:30 AM

## 2023-05-30 NOTE — Op Note (Signed)
[facility] Patient Name: Kelly Simmons Procedure Date: 05/30/2023 9:15 AM MRN: [id number] Endoscopist: Doristine Locks , MD, [id number] Age: 47 Referring MD:  Date of Birth: 30-Nov-1975 Gender: Female Account #: [id number] Procedure:                Colonoscopy Indications:              High risk colon cancer surveillance: Personal                            history of rectal carcinoid s/p resection in 2012.                            Recent feeling of rectal nodule vs hemorrhoids. Medicines:                Monitored Anesthesia Care Procedure:                Pre-Anesthesia Assessment:                           - Prior to the procedure, a History and Physical                            was performed, and patient medications and                            allergies were reviewed. The patient's tolerance of                            previous anesthesia was also reviewed. The risks                            and benefits of the procedure and the sedation                            options and risks were discussed with the patient.                            All questions were answered, and informed consent                            was obtained. Prior Anticoagulants: The patient has                            taken no anticoagulant or antiplatelet agents. ASA                            Grade Assessment: II - A patient with mild systemic                            disease. After reviewing the risks and benefits,                            the patient was deemed in satisfactory condition to  undergo the procedure.                           After obtaining informed consent, the colonoscope                            was passed under direct vision. Throughout the                            procedure, the patient's blood pressure, pulse, and                            oxygen saturations were monitored continuously. The                            CF [id number]  #[id number] was introduced through the anus                            and advanced to the the cecum, identified by                            appendiceal orifice and ileocecal valve. The                            colonoscopy was performed without difficulty. The                            patient tolerated the procedure well. The quality                            of the bowel preparation was good. The ileocecal                            valve, appendiceal orifice, and rectum were                            photographed. Scope In: 9:31:46 AM Scope Out: 9:47:37 AM Scope Withdrawal Time: 0 hours 10 minutes 38 seconds  Total Procedure Duration: 0 hours 15 minutes 51 seconds  Findings:                 The perianal and digital rectal examinations were                            normal.                           Multiple medium-mouthed and small-mouthed                            diverticula were found in the transverse colon and                            ascending colon.  A small post polypectomy scar was found in the                            rectum. The scar tissue was healthy in appearance                            on both white light and narrow-band imaging. There                            was no evidence of the previous polyp. Biopsies                            were taken with a cold forceps for histology.                            Estimated blood loss was minimal.                           The retroflexed view of the distal rectum and anal                            verge was normal and showed no anal or rectal                            abnormalities.                           The mucosa was otherwise normal appearing                            throughout the remainder of the entire colon. Complications:            No immediate complications. Estimated Blood Loss:     Estimated blood loss was minimal. Impression:               - Diverticulosis in the  transverse colon and in the                            ascending colon.                           - Well-healed post-polypectomy scar in the rectum.                            Biopsied.                           - The distal rectum and anal verge are normal on                            retroflexion view.                           - Normal mucosa in the entire examined colon. Recommendation:           - Patient  has a contact number available for                            emergencies. The signs and symptoms of potential                            delayed complications were discussed with the                            patient. Return to normal activities tomorrow.                            Written discharge instructions were provided to the                            patient.                           - Resume previous diet.                           - Continue present medications.                           - Await pathology results.                           - Repeat colonoscopy in 10 years for screening                            purposes.                           - Return to GI office PRN. Doristine Locks, MD 05/30/2023 9:55:48 AM

## 2023-05-31 ENCOUNTER — Telehealth: Payer: Self-pay

## 2023-05-31 NOTE — Telephone Encounter (Signed)
FU call placed , busy signal obtained.

## 2023-06-01 LAB — SURGICAL PATHOLOGY

## 2023-06-04 ENCOUNTER — Encounter: Payer: Self-pay | Admitting: Medical-Surgical

## 2023-06-04 DIAGNOSIS — E785 Hyperlipidemia, unspecified: Secondary | ICD-10-CM

## 2023-06-30 ENCOUNTER — Ambulatory Visit (INDEPENDENT_AMBULATORY_CARE_PROVIDER_SITE_OTHER): Payer: 59 | Admitting: Medical-Surgical

## 2023-06-30 ENCOUNTER — Encounter: Payer: Self-pay | Admitting: Medical-Surgical

## 2023-06-30 VITALS — BP 150/96 | HR 65 | Resp 20 | Ht 61.0 in | Wt 163.0 lb

## 2023-06-30 DIAGNOSIS — I10 Essential (primary) hypertension: Secondary | ICD-10-CM

## 2023-06-30 DIAGNOSIS — Z09 Encounter for follow-up examination after completed treatment for conditions other than malignant neoplasm: Secondary | ICD-10-CM | POA: Diagnosis not present

## 2023-06-30 NOTE — Progress Notes (Signed)
        Established patient visit  History, exam, impression, and plan:  1. Hospital discharge follow-up (Primary) Pleasant 48 year old accompanied by her husband presenting today for hospital discharge follow-up after seizure-like activity.  Was sleeping in bed and her husband noted that she pulled 1 arm out of the cover and then the other which was quickly followed by seizure-like activity that lasted approximately 45 seconds.  He called 911 and she was taken to the hospital for further evaluation.  She was referred to neurology and discharged on Keppra which she is taking as prescribed.  Notes that Keppra makes her a little bit sleepy but she is tolerating it otherwise.  Has had no further seizure-like activity.  She was able to see neurology this morning who recommended better blood pressure control and increased her valsartan  to 80 mg daily along with HCTZ 25 mg daily.  She took an extra dose of the 40 mg this morning to equal 80 and notes that her blood pressure has come down a bit.  They are planning for MRI brain and EEG for further evaluation of seizures.  She is aware of recommendations to avoid driving for 6 months per [place]  law.  Verbalized understanding of safety measures regarding patients with seizure-like activity.  2. Essential hypertension As noted above, her blood pressure was running high.  This morning it was 180/108 at the neurologist office however after the appointment it had come down with a systolic in 160s.  On arrival today she is at 150/96.  She does have a blood pressure cuff at home but has not been monitoring her blood pressure regularly.  Working on a low-sodium diet and usually avoids fast food and eating out at restaurants.  Denies any concerning cardiopulmonary symptoms today.  Exam overall reassuring.  Recommend checking blood pressure at home with a goal of 130/80.  Keep a log of readings twice daily over the next 2 weeks.  Return for a nurse visit for blood  pressure check and bring her cuff and the readings log with her for further evaluation.   Procedures performed this visit: None.  Return in about 2 weeks (around 07/14/2023) for nurse visit for BP check.  __________________________________ Zada FREDRIK Palin, DNP, APRN, FNP-BC Primary Care and Sports Medicine [facility] [place]

## 2023-07-08 ENCOUNTER — Encounter: Payer: Self-pay | Admitting: Medical-Surgical

## 2023-07-18 ENCOUNTER — Ambulatory Visit: Payer: 59 | Admitting: Medical-Surgical

## 2023-07-18 VITALS — BP 151/91 | HR 61

## 2023-07-18 DIAGNOSIS — I10 Essential (primary) hypertension: Secondary | ICD-10-CM

## 2023-07-18 MED ORDER — AMLODIPINE BESYLATE 5 MG PO TABS: 5.0000 mg | ORAL_TABLET | Freq: Every day | ORAL | 1 refills | Status: DC

## 2023-07-18 NOTE — Progress Notes (Signed)
   Established Patient Office Visit  Subjective   Patient ID: CONITA PILLE, female    DOB: 12-20-1975  Age: 48 y.o. MRN: [id number]  Chief Complaint  Patient presents with   Hypertension    HPI  CARLON TROLINGER is here for blood pressure check. Denies chest pain or shortness of breath. She does report some dizziness. She believes it is due to a new medication, Keppra.   Home Monitor reading in office today was 150/107. The office monitor blood pressure was 145/99.  ROS    Objective:     BP (!) 151/91   Pulse 61   SpO2 100%    Physical Exam   No results found for any visits on 07/18/23.    The ASCVD Risk score (Arnett DK, et al., 2019) failed to calculate for the following reasons:   The valid HDL cholesterol range is 20 to 100 mg/dL    Assessment & Plan:  Hypertension - Patient agreed to switch from valsartan to amlodipine 5 mg.   Problem List Items Addressed This Visit       Unprioritized   Essential hypertension - Primary    Return in about 2 weeks (around 08/01/2023) for nurse visit blood pressure check. Earna Coder, Janalyn Harder, CMA

## 2023-07-28 ENCOUNTER — Ambulatory Visit: Payer: 59 | Admitting: Medical-Surgical

## 2023-07-28 VITALS — BP 115/76 | HR 72 | Resp 20 | Ht 61.0 in | Wt 162.0 lb

## 2023-07-28 DIAGNOSIS — D3A026 Benign carcinoid tumor of the rectum: Secondary | ICD-10-CM | POA: Diagnosis not present

## 2023-07-28 DIAGNOSIS — I10 Essential (primary) hypertension: Secondary | ICD-10-CM

## 2023-07-28 DIAGNOSIS — R61 Generalized hyperhidrosis: Secondary | ICD-10-CM | POA: Diagnosis not present

## 2023-07-28 DIAGNOSIS — F419 Anxiety disorder, unspecified: Secondary | ICD-10-CM

## 2023-07-28 DIAGNOSIS — F32A Depression, unspecified: Secondary | ICD-10-CM

## 2023-07-28 NOTE — Progress Notes (Signed)
        Established patient visit  History, exam, impression, and plan:  1. Essential hypertension (Primary) Pleasant 48 year old female presenting today with a history of hypertension.  We recently made some changes to her blood pressure regimen and now she is on amlodipine 5 mg daily and HCTZ 25 mg daily.  Tolerating both medications well without side effects.  Has blood pressure cuff at home and checks occasionally.  Notes that she had a headache the other day and checked her blood pressure but it looked really good.  Is not exercising regularly but has plans to get restarted on this because she would prefer not to be on 2 different medications.  Denies any concerning symptoms today.  No lower extremity swelling.  Cardiopulmonary exam is normal.  As she wants to try to cut down and her blood pressure is beautiful today, advised that she is welcome to try having her HCTZ dose to 12.5 mg daily for 2 weeks then come in for a nurse visit for blood pressure check.  Continue amlodipine 5 mg daily.  She will let me know once she starts some exercise and cuts the HCTZ in half so that we can do a nurse visit for blood pressure check 2 weeks later.  2. Anxiety and depression This has been managed by OB/GYN today.  She is taking Effexor 150 mg daily, tolerating well without side effects.  Feels that it works well for mood and night sweats.  Denies SI/HI.  Continue Effexor as prescribed.  3. Night sweats See above.  4. Benign carcinoid tumor of rectum Managed by GI.  Recent colonoscopy.   Procedures performed this visit: None.  Return in about 6 months (around 01/25/2024) for HTN/mood follow up.  __________________________________ Thayer Ohm, DNP, APRN, FNP-BC Primary Care and Sports Medicine [facility]

## 2023-08-01 ENCOUNTER — Ambulatory Visit: Payer: 59

## 2023-08-09 ENCOUNTER — Other Ambulatory Visit: Payer: Self-pay | Admitting: Medical-Surgical

## 2023-09-10 ENCOUNTER — Other Ambulatory Visit: Payer: Self-pay | Admitting: Medical-Surgical

## 2023-10-09 ENCOUNTER — Other Ambulatory Visit: Payer: Self-pay | Admitting: Medical-Surgical

## 2023-11-15 ENCOUNTER — Other Ambulatory Visit: Payer: Self-pay | Admitting: Medical-Surgical

## 2023-11-15 DIAGNOSIS — Z1231 Encounter for screening mammogram for malignant neoplasm of breast: Secondary | ICD-10-CM

## 2023-11-17 ENCOUNTER — Telehealth (INDEPENDENT_AMBULATORY_CARE_PROVIDER_SITE_OTHER): Admitting: Medical-Surgical

## 2023-11-17 ENCOUNTER — Encounter: Payer: Self-pay | Admitting: Medical-Surgical

## 2023-11-17 ENCOUNTER — Telehealth: Admitting: Medical-Surgical

## 2023-11-17 DIAGNOSIS — F419 Anxiety disorder, unspecified: Secondary | ICD-10-CM

## 2023-11-17 DIAGNOSIS — R569 Unspecified convulsions: Secondary | ICD-10-CM

## 2023-11-17 DIAGNOSIS — F32A Depression, unspecified: Secondary | ICD-10-CM | POA: Diagnosis not present

## 2023-11-17 NOTE — Progress Notes (Signed)
 Virtual Visit via Video Note  I connected with Kelly Simmons on 11/17/23 at 10:30 AM EDT by a video enabled telemedicine application and verified that I am speaking with the correct person using two identifiers.   I discussed the limitations of evaluation and management by telemedicine and the availability of in person appointments. The patient expressed understanding and agreed to proceed.  Patient location: home Provider locations: office  Subjective:    CC: Seizures, mental health  HPI: Pleasant 48 year old female presenting via MyChart video visit to discuss updates on her health.  She had another seizure in March when she was on a Zoom call at work.  She was witnessed chewing her lip just before she fell out of her chair.  She has gone back to neurology where they have diagnosed her with epilepsy and tonic-clonic seizures.  She notes that she is under a lot of stress which does not help the situation.  She does work from home 2 days a week now which has been very helpful.  Unfortunately, her mood has been difficult to manage and the Effexor  is not working as well as she would like.  She was taking Keppra per neurology however they feel this may have worsened her mental health concerns.  They are switching her from Keppra to oxcarbazepine and she is in the middle of the titration process.  Previously did counseling but this was virtual and she did not feel like it was a good connection.  Is interested in restarting counseling but would like in person appointments.  Would like a referral to psychiatry as she feels like she may have some ADHD on top of the anxiety and depression.   Past medical history, Surgical history, Family history not pertinant except as noted below, Social history, Allergies, and medications have been entered into the medical record, reviewed, and corrections made.   Review of Systems: See HPI for pertinent positives and negatives.   Objective:    General: Speaking  clearly in complete sentences without any shortness of breath.  Alert and oriented x3.  Normal judgment. No apparent acute distress.  Impression and Recommendations:    1. Anxiety and depression (Primary) Discussed Effexor  dosing and the potential for increasing to 225 mg daily.  She feels that the Keppra was truly contributing so plans to complete the taper off of Keppra and onto oxcarbazepine.  If her mood does not improve, she will consider increasing the Effexor  dose.  Referring to psychiatry and behavioral health for counseling. - Ambulatory referral to Psychiatry - Ambulatory referral to Behavioral Health  2. Seizure [facility]) Managed by neurology.   I discussed the assessment and treatment plan with the patient. The patient was provided an opportunity to ask questions and all were answered. The patient agreed with the plan and demonstrated an understanding of the instructions.   The patient was advised to call back or seek an in-person evaluation if the symptoms worsen or if the condition fails to improve as anticipated.  Return if symptoms worsen or fail to improve.  Maryl Snook, DNP, APRN, FNP-BC [facility] and Sports Medicine

## 2023-11-24 ENCOUNTER — Encounter

## 2023-11-24 DIAGNOSIS — Z1231 Encounter for screening mammogram for malignant neoplasm of breast: Secondary | ICD-10-CM

## 2023-12-07 ENCOUNTER — Encounter

## 2023-12-08 ENCOUNTER — Ambulatory Visit: Payer: Self-pay

## 2023-12-08 ENCOUNTER — Encounter

## 2023-12-08 NOTE — Telephone Encounter (Signed)
 FYI Only or Action Required?: FYI only for provider  Patient was last seen in primary care on 11/17/2023 by Cherre Cornish, NP. Called Nurse Triage reporting Vaginal Discharge and bilateral back pain. Symptoms began several days ago. Interventions attempted: Nothing. Symptoms are: gradually worsening.  Triage Disposition: See PCP When Office is Open (Within 3 Days)  Patient/caregiver understands and will follow disposition?: Yes        Copied from CRM [id number]. Topic: Clinical - Red Word Triage >> Dec 08, 2023  8:26 AM Retta Caster wrote: Red Word that prompted transfer to Nurse Triage: Vaginal discharge since 06/09 /Back pain Reason for Disposition  Abnormal color vaginal discharge (i.e., yellow, green, gray)  Answer Assessment - Initial Assessment Questions 1. DISCHARGE: Describe the discharge. (e.g., white, yellow, green, gray, foamy, cottage cheese-like)     Watery but greenish/yellow 2. ODOR: Is there a bad odor?     Patient denies fishy odor but states her smell and taste is off since Covid 3. ONSET: When did the discharge begin?     Monday  4. RASH: Is there a rash in the genital area? If Yes, ask: Describe it. (e.g., redness, blisters, sores, bumps)     Patient states her genital area feels inflamed 5. ABDOMEN PAIN: Are you having any abdomen pain? If Yes, ask: What does it feel like?  (e.g., crampy, dull, intermittent, constant)      Patient states she had lower abdominal pain on Tuesday evening 6. ABDOMEN PAIN SEVERITY: If present, ask: How bad is it? (e.g., Scale 1-10; mild, moderate, or severe)   - MILD (1-3): Doesn't interfere with normal activities, abdomen soft and not tender to touch.    - MODERATE (4-7): Interferes with normal activities or awakens from sleep, abdomen tender to touch.    - SEVERE (8-10): Excruciating pain, doubled over, unable to do any normal activities. (R/O peritonitis)      5 - very sharp pain lasting a minute and went away and hasn't  come back 7. CAUSE: What do you think is causing the discharge? Have you had the same problem before? What happened then?     Patient states her and her spouse had sexual intercourse for the first time in a few months and she was told she may have BV by a family member 8. OTHER SYMPTOMS: Do you have any other symptoms? (e.g., fever, itching, vaginal bleeding, pain with urination, injury to genital area, vaginal foreign body)     Lower back pain both sides that started at same time, irritation while urinating  Denies fever  Protocols used: Vaginal Discharge-A-AH

## 2023-12-08 NOTE — Telephone Encounter (Signed)
 This was a duplicate encounter, patient was already triaged. This encounter was created in error - please disregard.

## 2023-12-08 NOTE — Telephone Encounter (Signed)
 Patient scheduled for June 13th with Jade Breeback

## 2023-12-09 ENCOUNTER — Ambulatory Visit: Admitting: Physician Assistant

## 2023-12-09 ENCOUNTER — Ambulatory Visit: Payer: Self-pay | Admitting: Family Medicine

## 2023-12-09 VITALS — BP 141/80 | HR 85 | Temp 98.7°F | Ht 61.0 in | Wt 169.0 lb

## 2023-12-09 DIAGNOSIS — R300 Dysuria: Secondary | ICD-10-CM | POA: Diagnosis not present

## 2023-12-09 DIAGNOSIS — R1030 Lower abdominal pain, unspecified: Secondary | ICD-10-CM

## 2023-12-09 DIAGNOSIS — N898 Other specified noninflammatory disorders of vagina: Secondary | ICD-10-CM | POA: Diagnosis not present

## 2023-12-09 LAB — POCT URINALYSIS DIP (CLINITEK)
Bilirubin, UA: NEGATIVE
Blood, UA: NEGATIVE
Glucose, UA: NEGATIVE mg/dL
Ketones, POC UA: NEGATIVE mg/dL
Leukocytes, UA: NEGATIVE
Nitrite, UA: NEGATIVE
POC PROTEIN,UA: NEGATIVE
Spec Grav, UA: 1.015
Urobilinogen, UA: 0.2 U/dL
pH, UA: 7.5

## 2023-12-09 NOTE — Progress Notes (Unsigned)
   Established Patient Office Visit  Subjective   Patient ID: Kelly Simmons, female    DOB: February 05, 1976  Age: 48 y.o. MRN: [id number]  Chief Complaint  Patient presents with   Medical Management of Chronic Issues    Vaginal discharge    HPI  {History (Optional):23778}  ROS    Objective:     BP (!) 164/99   Pulse 85   Temp 98.7 F (37.1 C) (Oral)   Ht 5' 1 (1.549 m)   Wt 169 lb (76.7 kg)   SpO2 99%   BMI 31.93 kg/m  {Vitals History (Optional):23777}  Physical Exam   Results for orders placed or performed in visit on 12/09/23  POCT URINALYSIS DIP (CLINITEK)  Result Value Ref Range   Color, UA yellow yellow   Clarity, UA clear clear   Glucose, UA negative negative mg/dL   Bilirubin, UA negative negative   Ketones, POC UA negative negative mg/dL   Spec Grav, UA 8.657 8.469 - 1.025   Blood, UA negative negative   pH, UA 7.5 5.0 - 8.0   POC PROTEIN,UA negative negative, trace   Urobilinogen, UA 0.2 0.2 or 1.0 E.U./dL   Nitrite, UA Negative Negative   Leukocytes, UA Negative Negative    {Labs (Optional):23779}  The ASCVD Risk score (Arnett DK, et al., 2019) failed to calculate for the following reasons:   The valid HDL cholesterol range is 20 to 100 mg/dL    Assessment & Plan:   Problem List Items Addressed This Visit   None Visit Diagnoses       Dysuria    -  Primary   Relevant Orders   NuSwab Vaginitis Plus (VG+)   POCT URINALYSIS DIP (CLINITEK) (Completed)   Urine Culture       No follow-ups on file.    Tamora Huneke, PA-C

## 2023-12-09 NOTE — Patient Instructions (Signed)
 Sounds like could be constipation:  Start miralax 1 packet/capful twice a day with 4oz of water/juice until stooling regularly Increase hydration Will call with urine culture and swab results  Constipation, Adult Constipation is when a person has fewer than three bowel movements in a week, has difficulty having a bowel movement, or has stools (feces) that are dry, hard, or larger than normal. Constipation may be caused by an underlying condition. It may become worse with age if a person takes certain medicines and does not take in enough fluids. Follow these instructions at home: Eating and drinking  Eat foods that have a lot of fiber, such as beans, whole grains, and fresh fruits and vegetables. Limit foods that are low in fiber and high in fat and processed sugars, such as fried or sweet foods. These include french fries, hamburgers, cookies, candies, and soda. Drink enough fluid to keep your urine pale yellow. General instructions Exercise regularly or as told by your health care provider. Try to do 150 minutes of moderate exercise each week. Use the bathroom when you have the urge to go. Do not hold it in. Take over-the-counter and prescription medicines only as told by your health care provider. This includes any fiber supplements. During bowel movements: Practice deep breathing while relaxing the lower abdomen. Practice pelvic floor relaxation. Watch your condition for any changes. Let your health care provider know about them. Keep all follow-up visits as told by your health care provider. This is important. Contact a health care provider if: You have pain that gets worse. You have a fever. You do not have a bowel movement after 4 days. You vomit. You are not hungry or you lose weight. You are bleeding from the opening between the buttocks (anus). You have thin, pencil-like stools. Get help right away if: You have a fever and your symptoms suddenly get worse. You leak stool or  have blood in your stool. Your abdomen is bloated. You have severe pain in your abdomen. You feel dizzy or you faint. Summary Constipation is when a person has fewer than three bowel movements in a week, has difficulty having a bowel movement, or has stools (feces) that are dry, hard, or larger than normal. Eat foods that have a lot of fiber, such as beans, whole grains, and fresh fruits and vegetables. Drink enough fluid to keep your urine pale yellow. Take over-the-counter and prescription medicines only as told by your health care provider. This includes any fiber supplements. This information is not intended to replace advice given to you by your health care provider. Make sure you discuss any questions you have with your health care provider. Document Revised: 04/28/2022 Document Reviewed: 04/28/2022 Elsevier Patient Education  2024 [employer].

## 2023-12-09 NOTE — Progress Notes (Signed)
 Urine sample looks OK

## 2023-12-11 LAB — URINE CULTURE

## 2023-12-12 ENCOUNTER — Encounter: Payer: Self-pay | Admitting: Physician Assistant

## 2023-12-12 NOTE — Progress Notes (Signed)
 Hi Kamera,  I am covering for Joy while she is out of the office for the next couple of weeks.  They did not see any overgrowth of bacteria or yeast on your swab.  A couple of tests are still pending.

## 2023-12-13 ENCOUNTER — Telehealth: Payer: Self-pay | Admitting: Physician Assistant

## 2023-12-13 LAB — NUSWAB VAGINITIS PLUS (VG+)
Candida albicans, NAA: NEGATIVE
Candida glabrata, NAA: NEGATIVE

## 2023-12-13 NOTE — Progress Notes (Signed)
 The rest of the results are in.  You are also negative for gonorrhea, chlamydia and trichomonas.

## 2023-12-13 NOTE — Telephone Encounter (Signed)
 Mychart message

## 2023-12-22 ENCOUNTER — Ambulatory Visit (HOSPITAL_BASED_OUTPATIENT_CLINIC_OR_DEPARTMENT_OTHER)
Admission: RE | Admit: 2023-12-22 | Discharge: 2023-12-22 | Disposition: A | Discharge status: Home / Self Care | Source: Ambulatory Visit | Attending: Medical-Surgical | Admitting: Medical-Surgical

## 2023-12-22 ENCOUNTER — Encounter

## 2023-12-22 ENCOUNTER — Encounter (HOSPITAL_BASED_OUTPATIENT_CLINIC_OR_DEPARTMENT_OTHER): Payer: Self-pay

## 2023-12-22 DIAGNOSIS — Z1231 Encounter for screening mammogram for malignant neoplasm of breast: Secondary | ICD-10-CM | POA: Insufficient documentation

## 2023-12-24 ENCOUNTER — Ambulatory Visit
Admission: RE | Admit: 2023-12-24 | Discharge: 2023-12-24 | Disposition: A | Discharge status: Home / Self Care | Source: Ambulatory Visit | Attending: Family Medicine | Admitting: Family Medicine

## 2023-12-24 ENCOUNTER — Other Ambulatory Visit: Payer: Self-pay

## 2023-12-24 VITALS — BP 136/83 | HR 71 | Temp 98.3°F

## 2023-12-24 DIAGNOSIS — H9203 Otalgia, bilateral: Secondary | ICD-10-CM | POA: Diagnosis not present

## 2023-12-24 DIAGNOSIS — H6691 Otitis media, unspecified, right ear: Secondary | ICD-10-CM

## 2023-12-24 MED ORDER — AMOXICILLIN-POT CLAVULANATE 875-125 MG PO TABS: 1.0000 | ORAL_TABLET | Freq: Two times a day (BID) | ORAL | 0 refills | Status: AC

## 2023-12-24 NOTE — ED Triage Notes (Signed)
 Pt c/o bilateral ear pain, worse in RT x 2 weeks. Denies fever. Tylenol prn.

## 2023-12-24 NOTE — Discharge Instructions (Addendum)
 Advised patient to take medication as directed with food to completion.  Encouraged increase daily water intake to 64 ounces per day while taking this medication.  Advised if symptoms worsen and/or unresolved please follow-up with your PCP or here for further evaluation.

## 2023-12-24 NOTE — ED Provider Notes (Signed)
 Kelly Simmons    CSN: [id number] Arrival date & time: 12/24/23  0848      History   Chief Complaint Chief Complaint  Patient presents with   Otalgia    APPT 9AM; Bilateral    HPI Kelly Simmons is a 48 y.o. female.   HPI pleasant 48 year old female presents with bilateral ear pain for 2 weeks.  Patient reports right is worse than the left.  PMH significant for HTN, anxiety and depression, and fibromyalgia  Past Medical History:  Diagnosis Date   Allergy    Anemia    Anxiety    Asthma    Depression    Herpes    High blood pressure    Rectal carcinoma (HCC)     Patient Active Problem List   Diagnosis Date Noted   Carcinoid tumor of rectum 09/09/2018   Essential hypertension 09/06/2018   Anxiety and depression 09/06/2018   Fibromyalgia 07/28/2015   Family history of breast cancer 07/13/2012    Past Surgical History:  Procedure Laterality Date   CESAREAN SECTION  2009   COLONOSCOPY  2013   6 months later from Sigmoidoscopy [facility]   LAPAROSCOPIC VAGINAL HYSTERECTOMY  06/15/2016   Ovaries still in place   SIGMOIDOSCOPY  2013   [facility]    OB History     Gravida  2   Para  2   Term  2   Preterm      AB      Living  2      SAB      IAB      Ectopic      Multiple      Live Births               Home Medications    Prior to Admission medications   Medication Sig Start Date End Date Taking? Authorizing Provider  amoxicillin -clavulanate (AUGMENTIN ) 875-125 MG tablet Take 1 tablet by mouth 2 (two) times daily for 10 days. 12/24/23 01/03/24 Yes Teddy Sharper, FNP  albuterol  (VENTOLIN  HFA) 108 (90 Base) MCG/ACT inhaler Inhale 1-2 puffs into the lungs every 4 (four) hours as needed for wheezing or shortness of breath. 04/13/23   Willo Mini, NP  amLODipine  (NORVASC ) 5 MG tablet TAKE 1 TABLET (5 MG TOTAL) BY MOUTH DAILY. 10/10/23   Willo Mini, NP  Cholecalciferol 100 MCG (4000 UT) CAPS Take by mouth. 07/04/17    [provider]  hydrochlorothiazide  (HYDRODIURIL ) 25 MG tablet Take 1 tablet (25 mg total) by mouth daily. 04/13/23   Willo Mini, NP  levETIRAcetam (KEPPRA) 750 MG tablet Take 750 mg by mouth 2 (two) times daily. 06/26/23   [provider]  meloxicam  (MOBIC ) 15 MG tablet Take 1 tablet (15 mg total) by mouth daily. 04/13/23   Willo Mini, NP  OXcarbazepine (TRILEPTAL) 150 MG tablet 150 mg. 11/10/23 11/09/24  [provider]  valACYclovir  (VALTREX ) 1000 MG tablet Take 2 tablets by mouth twice daily for one day as need for cold sore 04/13/23   Willo Mini, NP  venlafaxine  XR (EFFEXOR -XR) 150 MG 24 hr capsule Take 1 capsule (150 mg total) by mouth daily with breakfast. 03/17/23   Cleatus Moccasin, MD    Family History Family History  Problem Relation Age of Onset   High blood pressure Mother    Breast cancer Mother 58       Inflammatory   High blood pressure Father    Diabetes Father    Stroke  Father    Colon polyps Father    Kidney disease Father    High blood pressure Sister    High blood pressure Brother    Alzheimer's disease Maternal Grandmother    Prostate cancer Maternal Grandfather    Alzheimer's disease Paternal Grandmother    Heart attack Paternal Grandfather    Colon cancer Neg Hx    Esophageal cancer Neg Hx    Rectal cancer Neg Hx    Stomach cancer Neg Hx     Social History Social History   Tobacco Use   Smoking status: Former    Current packs/day: 0.50    Average packs/day: 0.5 packs/day for 4.0 years (2.0 ttl pk-yrs)    Types: Cigarettes   Smokeless tobacco: Never  Vaping Use   Vaping status: Never Used  Substance Use Topics   Alcohol use: Not Currently    Comment: socially   Drug use: Yes    Types: Marijuana     Allergies   Patient has no known allergies.   Review of Systems Review of Systems  HENT:  Positive for ear pain.   All other systems reviewed and are negative.    Physical Exam Triage Vital Signs ED Triage  Vitals  Encounter Vitals Group     BP 12/24/23 0918 136/83     Girls Systolic BP Percentile --      Girls Diastolic BP Percentile --      Boys Systolic BP Percentile --      Boys Diastolic BP Percentile --      Pulse Rate 12/24/23 0918 71     Resp --      Temp 12/24/23 0918 98.3 F (36.8 C)     Temp Source 12/24/23 0918 Oral     SpO2 12/24/23 0918 100 %     Weight --      Height --      Head Circumference --      Peak Flow --      Pain Score 12/24/23 0919 2     Pain Loc --      Pain Education --      Exclude from Growth Chart --    No data found.  Updated Vital Signs BP 136/83 (BP Location: Right Arm)   Pulse 71   Temp 98.3 F (36.8 C) (Oral)   SpO2 100%   Visual Acuity Right Eye Distance:   Left Eye Distance:   Bilateral Distance:    Right Eye Near:   Left Eye Near:    Bilateral Near:     Physical Exam Vitals and nursing note reviewed.  Constitutional:      Appearance: Normal appearance. She is obese.  HENT:     Head: Normocephalic and atraumatic.     Right Ear: Ear canal and external ear normal.     Left Ear: Ear canal and external ear normal.     Ears:     Comments: Right TM: Erythematous, bulging; Left TM: Red rimmed    Mouth/Throat:     Mouth: Mucous membranes are moist.     Pharynx: Oropharynx is clear.   Eyes:     Extraocular Movements: Extraocular movements intact.     Conjunctiva/sclera: Conjunctivae normal.     Pupils: Pupils are equal, round, and reactive to light.    Cardiovascular:     Rate and Rhythm: Normal rate and regular rhythm.     Pulses: Normal pulses.     Heart sounds: Normal heart sounds.  Pulmonary:  Effort: Pulmonary effort is normal.     Breath sounds: Normal breath sounds. No wheezing, rhonchi or rales.   Musculoskeletal:        General: Normal range of motion.   Skin:    General: Skin is warm and dry.   Neurological:     General: No focal deficit present.     Mental Status: She is alert and oriented to person,  place, and time. Mental status is at baseline.   Psychiatric:        Mood and Affect: Mood normal.        Behavior: Behavior normal.      UC Treatments / Results  Labs (all labs ordered are listed, but only abnormal results are displayed) Labs Reviewed - No data to display  EKG   Radiology No results found.  Procedures Procedures (including critical Simmons time)  Medications Ordered in UC Medications - No data to display  Initial Impression / Assessment and Plan / UC Course  I have reviewed the triage vital signs and the nursing notes.  Pertinent labs & imaging results that were available during my Simmons of the patient were reviewed by me and considered in my medical decision making (see chart for details).     MDM: 1.  Acute right otitis media-Rx'd Augmentin  875/125 mg tablet: Take 1 tablet twice daily x 10 days; 2.  Otalgia of both ears-patient complained of symptoms for 2 weeks left TM red reddened on exam today. Advised patient to take medication as directed with food to completion.  Encouraged increase daily water intake to 64 ounces per day while taking this medication.  Advised if symptoms worsen and/or unresolved please follow-up with your PCP or here for further evaluation.  Final Clinical Impressions(s) / UC Diagnoses   Final diagnoses:  Otalgia of both ears  Acute right otitis media     Discharge Instructions      Advised patient to take medication as directed with food to completion.  Encouraged increase daily water intake to 64 ounces per day while taking this medication.  Advised if symptoms worsen and/or unresolved please follow-up with your PCP or here for further evaluation.     ED Prescriptions     Medication Sig Dispense Auth. Provider   amoxicillin -clavulanate (AUGMENTIN ) 875-125 MG tablet Take 1 tablet by mouth 2 (two) times daily for 10 days. 20 tablet Sahmir Weatherbee, FNP      PDMP not reviewed this encounter.   Teddy Sharper,  FNP 12/24/23 [phone]

## 2023-12-26 ENCOUNTER — Ambulatory Visit: Payer: Self-pay | Admitting: Medical-Surgical

## 2024-01-04 ENCOUNTER — Ambulatory Visit (INDEPENDENT_AMBULATORY_CARE_PROVIDER_SITE_OTHER): Admitting: Professional

## 2024-01-04 ENCOUNTER — Encounter: Payer: Self-pay | Admitting: Professional

## 2024-01-04 DIAGNOSIS — F32A Depression, unspecified: Secondary | ICD-10-CM

## 2024-01-04 DIAGNOSIS — F419 Anxiety disorder, unspecified: Secondary | ICD-10-CM
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
## 2024-01-04 NOTE — Progress Notes (Signed)
   Kelly Simmons, [facility]

## 2024-01-12 ENCOUNTER — Ambulatory Visit (INDEPENDENT_AMBULATORY_CARE_PROVIDER_SITE_OTHER): Admitting: Professional

## 2024-01-12 ENCOUNTER — Encounter: Payer: Self-pay | Admitting: Professional

## 2024-01-12 DIAGNOSIS — F419 Anxiety disorder, unspecified: Secondary | ICD-10-CM

## 2024-01-12 DIAGNOSIS — F32A Depression, unspecified: Secondary | ICD-10-CM
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
## 2024-01-12 NOTE — Progress Notes (Signed)
   Kelly Simmons, [facility]

## 2024-01-17 ENCOUNTER — Other Ambulatory Visit: Payer: Self-pay | Admitting: Medical-Surgical

## 2024-01-18 ENCOUNTER — Ambulatory Visit: Admitting: Professional

## 2024-01-25 ENCOUNTER — Ambulatory Visit: Payer: 59 | Admitting: Medical-Surgical

## 2024-01-27 ENCOUNTER — Other Ambulatory Visit: Payer: Self-pay | Admitting: Medical-Surgical

## 2024-01-27 DIAGNOSIS — R61 Generalized hyperhidrosis: Secondary | ICD-10-CM

## 2024-01-27 MED ORDER — AMLODIPINE BESYLATE 5 MG PO TABS: 5.0000 mg | ORAL_TABLET | Freq: Every day | ORAL | 0 refills | Status: DC

## 2024-01-27 NOTE — Telephone Encounter (Signed)
 Copied from CRM [id number]. Topic: Clinical - Medication Refill >> Jan 27, 2024  8:10 AM Carrielelia G wrote: Medication: amLODipine  (NORVASC ) 5 MG tablet hydrochlorothiazide  (HYDRODIURIL ) 25 MG tablet meloxicam  (MOBIC ) 15 MG tablet venlafaxine  XR (EFFEXOR -XR) 150 MG 24 hr capsule  Has the patient contacted their pharmacy? Yes (Agent: If no, request that the patient contact the pharmacy for the refill. If patient does not wish to contact the pharmacy document the reason why and proceed with request.) (Agent: If yes, when and what did the pharmacy advise?)  This is the patient's preferred pharmacy:  [id number] - [place], [place] - [address] [place] [address] Phone: [phone] Fax: [phone]  CIs this the correct pharmacy for this prescription? Yes If no, delete pharmacy and type the correct one.    Is the patient out of the medication? Yes  Has the patient been seen for an appointment in the last year OR does the patient have an upcoming appointment? Yes  Can we respond through MyChart? Yes  Agent: Please be advised that Rx refills may take up to 3 business days. We ask that you follow-up with your pharmacy.

## 2024-01-31 ENCOUNTER — Ambulatory Visit (INDEPENDENT_AMBULATORY_CARE_PROVIDER_SITE_OTHER): Admitting: Professional

## 2024-01-31 ENCOUNTER — Encounter: Payer: Self-pay | Admitting: Professional

## 2024-01-31 DIAGNOSIS — F32A Depression, unspecified: Secondary | ICD-10-CM

## 2024-01-31 DIAGNOSIS — F419 Anxiety disorder, unspecified: Secondary | ICD-10-CM

## 2024-01-31 NOTE — Progress Notes (Signed)
 [facility] Counselor/Therapist Progress Note  Patient ID: Kelly Simmons, MRN: [id number],    Date: 01/31/2024  Time Spent: 48 minutes 9-948am   Treatment Type: Individual Therapy  Risk Assessment: Danger to Self:  No Self-injurious Behavior: No Danger to Others: No  Subjective: This session was held via Careg[facility]. The patient consented to audio teletherapy and was located in her home during this session. She is aware it is the responsibility of the patient to secure confidentiality on her end of the session. The provider was in a private home office for the duration of this session.    The patient arrived on time for her Caregility appointment.  Issues addressed: 1-mood -doing pretty good -blah over past couple days more on the pleasant side, feeling really grateful and doing a lot of self-care 2-family -uncle had emergency surgery on Sunday and is still inpt but doing pretty good 3-vacation with her family and siblings family -was a really good 4-wants to be happier -indicators: not being tearful and sad, in the mornings feels more grateful and peaceful confident and content -in the evening she is drained -during vacation she was peaceful and thankful for her family -this week she has been a little indifferent not ready to be stressed daily from work but still grateful and present 5-balancing work and family -logical vs emotion based approach -how to prioritize self and marriage above all else -training children to leave home with all the needed skills -having a written calendar to include all components (self-care, spouse, home, children, friends)  Treatment Plan Problems: Balancing Work and [employer], Career Success Obstacles, Depression, Partner Relational Problems Symptoms: Experiences stress associated with balancing the multiple tasks associated with motherhood and work roles. Experiences role conflict between two or more roles (i.e.,  obligations or responsibilities for one role interfere with ability to perform duties for other roles). Describes stress and anxiety associated with cumulative demands of multiple roles. Experiences fatigue and difficulty concentrating (e.g., feeling overwhelmed) due to role overload. Experiences partner relationship strain and dissatisfaction due to inequitable division of responsibilities. Demonstrates disorganization, time management concerns, and an inability to maintain commitments due to role overload. Experiences parenting relationship strain (e.g., increased conflict, low parenting efficacy, child behavioral concerns). Experiences employment dissatisfaction due to limited or no recognition/credit for work efforts or product. Verbalizes feelings of powerlessness or hopelessness due to inequitable salary differentials. Feels lost or disoriented due to lack of mentoring opportunities or access to role models, particularly in female-dominated occupations. Experiences distress and role overload due to a work environment that limits the ability to integrate family responsibilities with career advancement. Demonstrates dysphoric affect (e.g., sadness, hopelessness, helplessness, guilt, shame). Evidences impaired ability to concentrate or make decisions. Reports fatigue or lack of energy. Evidences diminished interest or pleasure in current and future activities. Reports irritability and/or crying spells. Verbalizes difficulty coping adequately with stress and conflict related to multiple roles (e.g., partner, caretaker, worker, Consulting civil engineer). Describes poor interpersonal exchanges (e.g., isolation, loneliness, relationship distress). Experiences dissatisfaction, frustration, and hopelessness with regard to partner relationship. Describes poor communication with partner. Describes a lack of connection with partner, infrequent or no affection, and excessive involvement in activities outside of the  relationship to avoid closeness to partner. Verbalizes frustration and conflict in the partner relationship due to an imbalance in roles and responsibilities (e.g., childcare, home maintenance, work). Goals: Improve depressed mood to maximize effective social, occupational, and physical functioning. Develop the necessary skills for effective, open communication and mutually satisfying intimacy. Develop  healthy cognitive mechanisms to facilitate positive attitudes and beliefs about self within the context of one's environment to mitigate depressive symptoms. Identify and increase intrapersonal, interpersonal, and physical resources to foster positive coping strategies. Increase awareness of own role in relationship conflicts. Develop a sense of own personal power and self-esteem within the relationship. Commit to improve relationship by working together as a team. Paediatric nurse that involve taking a proactive stance in the work environment. Learn and implement various coping skills for dealing with work- and career-related inequalities. Develop assertiveness skills and techniques for application in work-related activities (e.g., establishing boundaries with coworkers, Garment/textile technologist). Explore career options that have been automatically ruled out due to low self-concept or limited opportunities. Develop a support system to assist with multiple roles and responsibilities. Develop a realistic perspective regarding demands and obligations of multiple roles and their completion. Eliminate depressive and anxiety symptoms associated with trying to balance multiple roles. Develop time management strategies to reduce role overload and role conflicts. Objectives target date for all objectives is 01/11/2025: Describe role and responsibilities associated with work and family and related thoughts, feelings, and behaviors. Describe at least three expectations related to work and  family obligations and their relationship to gender role socialization and emotional conflict. Clarify values and priorities. Implement assertiveness skills and limit setting. Learn and implement stress management and relaxation techniques to reduce fatigue, anxiety, and depressive symptoms. Communicate needs with partner regarding multiple role obligations. Learn and implement problem-solving and conflict-resolution skills. Verbalize feelings of distress and dissatisfaction related to obstacles to career success. Implement effective problem-solving skills to deal with a discriminatory work environment. Identify and replace at least three negative self-talk statements that perpetuate feelings of powerlessness or hopelessness. Articulate signs and symptoms of depression in current life experiences. Identify and replace cognitive self-talk that supports depression. Implement relaxation and meditation techniques to manage and process career frustrations. Understand and articulate how symptoms of depression compare to current clinical and research trends of depression for women. Implement behavioral interventions to overcome depression. Implement assertiveness to communicate needs and desires to others. Implement conflict resolution skills to alleviate interpersonal sources of depressed mood. Increase social contacts and communicate needs within existing interpersonal relationships. Increase the level of physical exercise. Express thoughts and feelings regarding the relationship in a direct manner. Both partners identify and verbalize at least three expectations for the relationship. Utilize at least two new conflict-resolution techniques to resolve issues reasonably. Verbalize the feelings associated with grieving the loss of the relationship. Increase time spent in enjoyable contact with the partner. Share family and childhood experiences with each other to increase understanding and  empathy. Intervention Direct the client to resources and social opportunities within the community; assist the client in integrating these resources into a plan to start building new social relationships. Use behavioral techniques (e.g., education, modeling, role-playing, corrective feedback, positive reinforcement) to teach the couple problem-solving and conflict-resolution skills, including defining the problem constructively and specifically, brainstorming options, compromise, choosing options, implementing a plan, and evaluating the results. Teach conflict-resolution techniques such as Do's & Don't's List and Fair Fighting Steps in The Intimate Enemy: How to Fight Fair in Love and Marriage  by Selmer and Conley; encourage the couple to practice these techniques in session and at home. Explore the family-of-origin history of each partner to discover patterns of destructive intimate relationship interactions that are being repeated in the present relationship; explore characteristics of a healthy relationship. Assist the client in identifying a clear verbal or  behavioral signal to be used by either partner to terminate interaction immediately if either senses impending abuse. Explore with the client her multiple roles and responsibilities associated with work and family; clarify related thoughts and feelings. Explore with the client her expectations regarding the balance between work and family, and discuss how this relates to internalized gender role stereotypes. Discuss the client's potential feelings of guilt, shame, and anxiety associated with not meeting internal or external expectations. Assist the client with identifying those expectations that are helpful and those that are potentially harmful; help the client discard those that are causing undue stress and strain and replace them with more realistic messages. Assign the client to read about progressive muscle relaxation and other calming  strategies in relevant books or treatment manuals (e.g., Progressive Relaxation Training by Thornell and Elmer; Mastery of Your Anxiety and Optometrist by Venson Richarda Jonne armin Valorie). Assist the client in identifying conflicts that can be addressed using communication, conflict-resolution, and/or problem-solving skills (see Behavioral Marital Therapy by [facility] and Veatrice in the Handbook of Family Therapy, 2nd ed. by Nevelyn and Knickerson [Eds.]). Use behavioral techniques (i.e., education, modeling, role-playing, corrective feedback, positive reinforcement) to teach communication skills, including assertive communication, offering positive feedback, active listening, making positive requests of others for behavior change, and giving negative feedback in an honest and respectful manner. Explore, along with the client, strategies for making her life more manageable and less stressful (e.g., waking up before the children to exercise or have a cup of tea, pack the children's lunches and backpacks the night before). Encourage the client to develop a schedule of roles and responsibilities with her partner; assign her to meet regularly with her partner to discuss, review, and revise the schedule as needed. Help the client to develop a realistic schedule that outlines the responsibilities of her partner and family members; help the client enlist the commitment of all individuals to the schedule. Refer and encourage the client to join relevant support groups (e.g., single mothers, overextended women, time management). Facilitate the client's development of a social support network to assist with multiple roles and responsibilities; develop a list of active support behaviors (e.g., babysitting, help with cleaning). Educate the client about, and encourage her to attend to, healthy eating, sleeping, and exercise. Explore the client's perception of her career success obstacles,  including both overt and covert discriminatory practices related to gender, race/ethnicity, religion, and sexual orientation. Validate the client's experience and explore how the work environment impacts distress and dissatisfaction; assist her in listing the three most frustrating aspects of her employment. Teach the client effective problem-solving skills (e.g., clarify the problem, brainstorm solutions, consult with others for additional solutions, list the pros and cons of each solution, select and implement a plan of action, evaluate the consequences, redirect  efforts if necessary) to deal with those aspects of the work environment that limit career development and participation. Reinforce the client's positive, realistic cognitive messages that enhance self-confidence and increase proactive career pursuits. Refer the client to shadowing or mentorship programs/workshops for aspiring career women. Refer the client to educational seminars, informational meetings, and other related activities to meet other career women and learn career success strategies. Encourage the client to share her feelings of depression to gain an insight into precipitating events and implications of symptoms; normalize her feelings of depression. Encourage the client to describe current and childhood experiences associated with key relationships (e.g., family-of-origin, peer and school relationships, dating relationships, female role models, extended family relationships), roles (e.g.,  partner, caretaker, worker, Consulting civil engineer), and cultural experiences (e.g., gender roles) that may contribute to depression. Assign the client to participate as fully as possible in a healthy exercise regimen. Assist the client to consider upcoming stressors; brainstorm with her 10 ways to mitigate depression in the future. Encourage the client to continue participation in positive social support systems. Offer the client booster sessions as  necessary. Encourage the client to discuss cognitive distortions, including automatic thoughts (e.g., negative view of self, future, experience) and negative schemas (e.g., core beliefs about self and others based on earlier childhood experiences); assess frequency of negative self-statements associated with depression. Educate the client on available options to supplement counseling along with potential implications (e.g., couples and family therapy, support networks, financial support). Reinforce the client's positive, reality-based cognitive messages that enhance self-confidence and increase adaptive action (see Positive Self-Talk in the Adult Psychotherapy Homework Planner, 2nd ed. by Jenniffer). Do behavioral experiments in which depressive automatic thoughts are treated as hypotheses/predictions, reality-based alternative hypotheses/ predictions are generated, and both are tested against the client's past, present, and/or future experiences.  Diagnosis:Anxiety and depression  Plan:  -create and begin using your schedule -meet again on Tuesday, February 14, 2024 at [employer].

## 2024-02-05 ENCOUNTER — Other Ambulatory Visit: Payer: Self-pay | Admitting: Obstetrics and Gynecology

## 2024-02-05 DIAGNOSIS — R61 Generalized hyperhidrosis: Secondary | ICD-10-CM

## 2024-02-14 ENCOUNTER — Ambulatory Visit (INDEPENDENT_AMBULATORY_CARE_PROVIDER_SITE_OTHER): Admitting: Professional

## 2024-02-14 ENCOUNTER — Encounter: Payer: Self-pay | Admitting: Professional

## 2024-02-14 DIAGNOSIS — F32A Depression, unspecified: Secondary | ICD-10-CM | POA: Diagnosis not present

## 2024-02-14 DIAGNOSIS — F419 Anxiety disorder, unspecified: Secondary | ICD-10-CM

## 2024-02-14 NOTE — Progress Notes (Signed)
 [facility] Counselor/Therapist Progress Note  Patient ID: Kelly Simmons, MRN: [id number],    Date: 02/14/2024  Time Spent: 46 minutes 9-946am   Treatment Type: Individual Therapy  Risk Assessment: Danger to Self:  No Self-injurious Behavior: No Danger to Others: No  Subjective: This session was held via Care[facility]. The patient consented to audio teletherapy and was located in her home during this session. She is aware it is the responsibility of the patient to secure confidentiality on her end of the session. The provider was in a private home office for the duration of this session.    The patient arrived on time for her Caregility appointment.  Issues addressed: 1-homework- did not use personal schedule -create and begin using your schedule -met with husband 15 minutes per day 2-mood -tracks on the app -pleasant, really grateful, active 3-family -uncle is doing better, more active with improved mood -he is seeing the MD today due to leaky wound -triggered her feelings of loss -she admits to having more concerned about her cousins because she know they will have to go through this 3-grief a-tearful b-suppresses so she doesn't have to manage -she emotes tearfulness in a variety of situations -if she didn't stay active she would probably sleep and be sad -she admits to increased irritability c-she admits she may feel upset with him since he still has a father and doesn't spend time with him d-loss of independence  -no driving due to seizures 4-marital -resentful because she doesn't feel cherished and has not since he cheated many years ago -pt struggling with the infidelity and the lie twenty years after -she has done better in the past two months -pt decided to let that resentment go, still has a little -pt feels done and she thinks that her spouse knows -he says he stays because of her and the kids -he appears to be there for the kids -there is no  intimacy in the relationship -pt is willing to try again but at the moment she doesn't feel like that -she thinks that being able to drive and feel free it might be different -pt does thinks she wants to learn more about her rights if choosing to separate/ divorce  Treatment Plan Problems: Balancing Work and [employer], Career Success Obstacles, Depression, Partner Relational Problems Symptoms: Experiences stress associated with balancing the multiple tasks associated with motherhood and work roles. Experiences role conflict between two or more roles (i.e., obligations or responsibilities for one role interfere with ability to perform duties for other roles). Describes stress and anxiety associated with cumulative demands of multiple roles. Experiences fatigue and difficulty concentrating (e.g., feeling overwhelmed) due to role overload. Experiences partner relationship strain and dissatisfaction due to inequitable division of responsibilities. Demonstrates disorganization, time management concerns, and an inability to maintain commitments due to role overload. Experiences parenting relationship strain (e.g., increased conflict, low parenting efficacy, child behavioral concerns). Experiences employment dissatisfaction due to limited or no recognition/credit for work efforts or product. Verbalizes feelings of powerlessness or hopelessness due to inequitable salary differentials. Feels lost or disoriented due to lack of mentoring opportunities or access to role models, particularly in female-dominated occupations. Experiences distress and role overload due to a work environment that limits the ability to integrate family responsibilities with career advancement. Demonstrates dysphoric affect (e.g., sadness, hopelessness, helplessness, guilt, shame). Evidences impaired ability to concentrate or make decisions. Reports fatigue or lack of energy. Evidences diminished interest or pleasure  in current and future activities. Reports irritability and/or  crying spells. Verbalizes difficulty coping adequately with stress and conflict related to multiple roles (e.g., partner, caretaker, worker, Consulting civil engineer). Describes poor interpersonal exchanges (e.g., isolation, loneliness, relationship distress). Experiences dissatisfaction, frustration, and hopelessness with regard to partner relationship. Describes poor communication with partner. Describes a lack of connection with partner, infrequent or no affection, and excessive involvement in activities outside of the relationship to avoid closeness to partner. Verbalizes frustration and conflict in the partner relationship due to an imbalance in roles and responsibilities (e.g., childcare, home maintenance, work). Goals: Improve depressed mood to maximize effective social, occupational, and physical functioning. Develop the necessary skills for effective, open communication and mutually satisfying intimacy. Develop healthy cognitive mechanisms to facilitate positive attitudes and beliefs about self within the context of one's environment to mitigate depressive symptoms. Identify and increase intrapersonal, interpersonal, and physical resources to foster positive coping strategies. Increase awareness of own role in relationship conflicts. Develop a sense of own personal power and self-esteem within the relationship. Commit to improve relationship by working together as a team. Paediatric nurse that involve taking a proactive stance in the work environment. Learn and implement various coping skills for dealing with work- and career-related inequalities. Develop assertiveness skills and techniques for application in work-related activities (e.g., establishing boundaries with coworkers, Garment/textile technologist). Explore career options that have been automatically ruled out due to low self-concept or limited opportunities. Develop a  support system to assist with multiple roles and responsibilities. Develop a realistic perspective regarding demands and obligations of multiple roles and their completion. Eliminate depressive and anxiety symptoms associated with trying to balance multiple roles. Develop time management strategies to reduce role overload and role conflicts. Objectives target date for all objectives is 01/11/2025: Describe role and responsibilities associated with work and family and related thoughts, feelings, and behaviors. Describe at least three expectations related to work and family obligations and their relationship to gender role socialization and emotional conflict. Clarify values and priorities. Implement assertiveness skills and limit setting. Learn and implement stress management and relaxation techniques to reduce fatigue, anxiety, and depressive symptoms. Communicate needs with partner regarding multiple role obligations. Learn and implement problem-solving and conflict-resolution skills. Verbalize feelings of distress and dissatisfaction related to obstacles to career success. Implement effective problem-solving skills to deal with a discriminatory work environment. Identify and replace at least three negative self-talk statements that perpetuate feelings of powerlessness or hopelessness. Articulate signs and symptoms of depression in current life experiences. Identify and replace cognitive self-talk that supports depression. Implement relaxation and meditation techniques to manage and process career frustrations. Understand and articulate how symptoms of depression compare to current clinical and research trends of depression for women. Implement behavioral interventions to overcome depression. Implement assertiveness to communicate needs and desires to others. Implement conflict resolution skills to alleviate interpersonal sources of depressed mood. Increase social contacts and communicate needs  within existing interpersonal relationships. Increase the level of physical exercise. Express thoughts and feelings regarding the relationship in a direct manner. Both partners identify and verbalize at least three expectations for the relationship. Utilize at least two new conflict-resolution techniques to resolve issues reasonably. Verbalize the feelings associated with grieving the loss of the relationship. Increase time spent in enjoyable contact with the partner. Share family and childhood experiences with each other to increase understanding and empathy. Intervention Direct the client to resources and social opportunities within the community; assist the client in integrating these resources into a plan to start building new social relationships. Use behavioral techniques (e.g.,  education, modeling, role-playing, corrective feedback, positive reinforcement) to teach the couple problem-solving and conflict-resolution skills, including defining the problem constructively and specifically, brainstorming options, compromise, choosing options, implementing a plan, and evaluating the results. Teach conflict-resolution techniques such as Do's & Don't's List and Fair Fighting Steps in The Intimate Enemy: How to Fight Fair in Love and Marriage  by Selmer and Conley; encourage the couple to practice these techniques in session and at home. Explore the family-of-origin history of each partner to discover patterns of destructive intimate relationship interactions that are being repeated in the present relationship; explore characteristics of a healthy relationship. Assist the client in identifying a clear verbal or behavioral signal to be used by either partner to terminate interaction immediately if either senses impending abuse. Explore with the client her multiple roles and responsibilities associated with work and family; clarify related thoughts and feelings. Explore with the client her expectations  regarding the balance between work and family, and discuss how this relates to internalized gender role stereotypes. Discuss the client's potential feelings of guilt, shame, and anxiety associated with not meeting internal or external expectations. Assist the client with identifying those expectations that are helpful and those that are potentially harmful; help the client discard those that are causing undue stress and strain and replace them with more realistic messages. Assign the client to read about progressive muscle relaxation and other calming strategies in relevant books or treatment manuals (e.g., Progressive Relaxation Training by Thornell and Elmer; Mastery of Your Anxiety and Optometrist by Venson Richarda Jonne armin Valorie). Assist the client in identifying conflicts that can be addressed using communication, conflict-resolution, and/or problem-solving skills (see Behavioral Marital Therapy by [facility] and Veatrice in the Handbook of Family Therapy, 2nd ed. by Nevelyn and Knickerson [Eds.]). Use behavioral techniques (i.e., education, modeling, role-playing, corrective feedback, positive reinforcement) to teach communication skills, including assertive communication, offering positive feedback, active listening, making positive requests of others for behavior change, and giving negative feedback in an honest and respectful manner. Explore, along with the client, strategies for making her life more manageable and less stressful (e.g., waking up before the children to exercise or have a cup of tea, pack the children's lunches and backpacks the night before). Encourage the client to develop a schedule of roles and responsibilities with her partner; assign her to meet regularly with her partner to discuss, review, and revise the schedule as needed. Help the client to develop a realistic schedule that outlines the responsibilities of her partner and family members; help the  client enlist the commitment of all individuals to the schedule. Refer and encourage the client to join relevant support groups (e.g., single mothers, overextended women, time management). Facilitate the client's development of a social support network to assist with multiple roles and responsibilities; develop a list of active support behaviors (e.g., babysitting, help with cleaning). Educate the client about, and encourage her to attend to, healthy eating, sleeping, and exercise. Explore the client's perception of her career success obstacles, including both overt and covert discriminatory practices related to gender, race/ethnicity, religion, and sexual orientation. Validate the client's experience and explore how the work environment impacts distress and dissatisfaction; assist her in listing the three most frustrating aspects of her employment. Teach the client effective problem-solving skills (e.g., clarify the problem, brainstorm solutions, consult with others for additional solutions, list the pros and cons of each solution, select and implement a plan of action, evaluate the consequences, redirect  efforts if necessary) to deal with those  aspects of the work environment that limit career development and participation. Reinforce the client's positive, realistic cognitive messages that enhance self-confidence and increase proactive career pursuits. Refer the client to shadowing or mentorship programs/workshops for aspiring career women. Refer the client to educational seminars, informational meetings, and other related activities to meet other career women and learn career success strategies. Encourage the client to share her feelings of depression to gain an insight into precipitating events and implications of symptoms; normalize her feelings of depression. Encourage the client to describe current and childhood experiences associated with key relationships (e.g., family-of-origin, peer and school  relationships, dating relationships, female role models, extended family relationships), roles (e.g., partner, caretaker, worker, Consulting civil engineer), and cultural experiences (e.g., gender roles) that may contribute to depression. Assign the client to participate as fully as possible in a healthy exercise regimen. Assist the client to consider upcoming stressors; brainstorm with her 10 ways to mitigate depression in the future. Encourage the client to continue participation in positive social support systems. Offer the client booster sessions as necessary. Encourage the client to discuss cognitive distortions, including automatic thoughts (e.g., negative view of self, future, experience) and negative schemas (e.g., core beliefs about self and others based on earlier childhood experiences); assess frequency of negative self-statements associated with depression. Educate the client on available options to supplement counseling along with potential implications (e.g., couples and family therapy, support networks, financial support). Reinforce the client's positive, reality-based cognitive messages that enhance self-confidence and increase adaptive action (see Positive Self-Talk in the Adult Psychotherapy Homework Planner, 2nd ed. by Jenniffer). Do behavioral experiments in which depressive automatic thoughts are treated as hypotheses/predictions, reality-based alternative hypotheses/ predictions are generated, and both are tested against the client's past, present, and/or future experiences.  Diagnosis:Anxiety and depression  Plan:  -complete written schedule -write down questions to ask attorney related to separation/divorce -schedule appt with attorney -keep appointment with attorney -meet again on Thursday, March 01, 2024 at [employer].

## 2024-03-01 ENCOUNTER — Encounter: Payer: Self-pay | Admitting: Professional

## 2024-03-01 ENCOUNTER — Encounter: Payer: Self-pay | Admitting: Medical-Surgical

## 2024-03-01 ENCOUNTER — Ambulatory Visit (INDEPENDENT_AMBULATORY_CARE_PROVIDER_SITE_OTHER): Admitting: Professional

## 2024-03-01 DIAGNOSIS — F4323 Adjustment disorder with mixed anxiety and depressed mood: Secondary | ICD-10-CM

## 2024-03-01 DIAGNOSIS — R61 Generalized hyperhidrosis: Secondary | ICD-10-CM

## 2024-03-01 NOTE — Progress Notes (Signed)
 [facility] Counselor/Therapist Progress Note  Patient ID: Kelly Simmons, MRN: [id number],    Date: 03/01/2024  Time Spent: 46 minutes [id number]   Treatment Type: Individual Therapy  Risk Assessment: Danger to Self:  No Self-injurious Behavior: No Danger to Others: No  Subjective: This session was held via Caregility. The patient consented to audio teletherapy and was located in her home during this session. She is aware it is the responsibility of the patient to secure confidentiality on her end of the session. The provider was in a private home office for the duration of this session.    The patient arrived on time for her Caregility appointment.  Issues addressed: 1-homework- yes -complete written schedule -write down questions to ask attorney related to separation/divorce -schedule appt with attorney -keep appointment with attorney -recreate first date- not done   -did go to college football game ad it was fun   -they did that frequently and always brings back good memories with kids   -they have had more time with each other and have talked   -she has made some efforts   -hug time with husband daily and feels like it is really difficult     -trying to repress negative thoughts     -is doing this because she needs to 2-marital -this past week and how it went she wants to repair -pt is thinking more that staying is for her twins -she has always thought that when the kids graduate peace -being intentional in spending time with each other -put the phone down and do not allow it to be primary -she likes scheduling time -ask for husband's input -pt's mood 03/01/2024    PHQ2-9 Depression Screening   Little interest or pleasure in doing things Several days  Feeling down, depressed, or hopeless Several days  PHQ-2 - Total Score 2  Trouble falling or staying asleep, or sleeping too much Not at all  Feeling tired or having little energy Nearly every day   Poor appetite or overeating  More than half the days  Feeling bad about yourself - or that you are a failure or have let yourself or your family down Several days  Trouble concentrating on things, such as reading the newspaper or watching television Nearly every day  Moving or speaking so slowly that other people could have noticed.  Or the opposite - being so fidgety or restless that you have been moving around a lot more than usual More than half the days  Thoughts that you would be better off dead, or hurting yourself in some way Not at all  PHQ2-9 Total Score 13  If you checked off any problems, how difficult have these problems made it for you to do your work, take care of things at home, or get along with other people Very difficult  Depression Interventions/Treatment Currently on Treatment, Medication   Treatment Plan Problems: Balancing Work and Family/Multiple Roles, Career Success Obstacles, Depression, Partner Relational Problems Symptoms: Experiences stress associated with balancing the multiple tasks associated with motherhood and work roles. Experiences role conflict between two or more roles (i.e., obligations or responsibilities for one role interfere with ability to perform duties for other roles). Describes stress and anxiety associated with cumulative demands of multiple roles. Experiences fatigue and difficulty concentrating (e.g., feeling overwhelmed) due to role overload. Experiences partner relationship strain and dissatisfaction due to inequitable division of responsibilities. Demonstrates disorganization, time management concerns, and an inability to maintain commitments due to role overload. Experiences  parenting relationship strain (e.g., increased conflict, low parenting efficacy, child behavioral concerns). Experiences employment dissatisfaction due to limited or no recognition/credit for work efforts or product. Verbalizes feelings of powerlessness or hopelessness  due to inequitable salary differentials. Feels lost or disoriented due to lack of mentoring opportunities or access to role models, particularly in female-dominated occupations. Experiences distress and role overload due to a work environment that limits the ability to integrate family responsibilities with career advancement. Demonstrates dysphoric affect (e.g., sadness, hopelessness, helplessness, guilt, shame). Evidences impaired ability to concentrate or make decisions. Reports fatigue or lack of energy. Evidences diminished interest or pleasure in current and future activities. Reports irritability and/or crying spells. Verbalizes difficulty coping adequately with stress and conflict related to multiple roles (e.g., partner, caretaker, worker, Consulting civil engineer). Describes poor interpersonal exchanges (e.g., isolation, loneliness, relationship distress). Experiences dissatisfaction, frustration, and hopelessness with regard to partner relationship. Describes poor communication with partner. Describes a lack of connection with partner, infrequent or no affection, and excessive involvement in activities outside of the relationship to avoid closeness to partner. Verbalizes frustration and conflict in the partner relationship due to an imbalance in roles and responsibilities (e.g., childcare, home maintenance, work). Goals: Improve depressed mood to maximize effective social, occupational, and physical functioning. Develop the necessary skills for effective, open communication and mutually satisfying intimacy. Develop healthy cognitive mechanisms to facilitate positive attitudes and beliefs about self within the context of one's environment to mitigate depressive symptoms. Identify and increase intrapersonal, interpersonal, and physical resources to foster positive coping strategies. Increase awareness of own role in relationship conflicts. Develop a sense of own personal power and self-esteem within the  relationship. Commit to improve relationship by working together as a team. Paediatric nurse that involve taking a proactive stance in the work environment. Learn and implement various coping skills for dealing with work- and career-related inequalities. Develop assertiveness skills and techniques for application in work-related activities (e.g., establishing boundaries with coworkers, Garment/textile technologist). Explore career options that have been automatically ruled out due to low self-concept or limited opportunities. Develop a support system to assist with multiple roles and responsibilities. Develop a realistic perspective regarding demands and obligations of multiple roles and their completion. Eliminate depressive and anxiety symptoms associated with trying to balance multiple roles. Develop time management strategies to reduce role overload and role conflicts. Objectives target date for all objectives is 01/11/2025: Describe role and responsibilities associated with work and family and related thoughts, feelings, and behaviors. Describe at least three expectations related to work and family obligations and their relationship to gender role socialization and emotional conflict. Clarify values and priorities. Implement assertiveness skills and limit setting. Learn and implement stress management and relaxation techniques to reduce fatigue, anxiety, and depressive symptoms. Communicate needs with partner regarding multiple role obligations. Learn and implement problem-solving and conflict-resolution skills. Verbalize feelings of distress and dissatisfaction related to obstacles to career success. Implement effective problem-solving skills to deal with a discriminatory work environment. Identify and replace at least three negative self-talk statements that perpetuate feelings of powerlessness or hopelessness. Articulate signs and symptoms of depression in current life  experiences. Identify and replace cognitive self-talk that supports depression. Implement relaxation and meditation techniques to manage and process career frustrations. Understand and articulate how symptoms of depression compare to current clinical and research trends of depression for women. Implement behavioral interventions to overcome depression. Implement assertiveness to communicate needs and desires to others. Implement conflict resolution skills to alleviate interpersonal sources of depressed mood. Increase  social contacts and communicate needs within existing interpersonal relationships. Increase the level of physical exercise. Express thoughts and feelings regarding the relationship in a direct manner. Both partners identify and verbalize at least three expectations for the relationship. Utilize at least two new conflict-resolution techniques to resolve issues reasonably. Verbalize the feelings associated with grieving the loss of the relationship. Increase time spent in enjoyable contact with the partner. Share family and childhood experiences with each other to increase understanding and empathy. Intervention Direct the client to resources and social opportunities within the community; assist the client in integrating these resources into a plan to start building new social relationships. Use behavioral techniques (e.g., education, modeling, role-playing, corrective feedback, positive reinforcement) to teach the couple problem-solving and conflict-resolution skills, including defining the problem constructively and specifically, brainstorming options, compromise, choosing options, implementing a plan, and evaluating the results. Teach conflict-resolution techniques such as Do's & Don't's List and Fair Fighting Steps in The Intimate Enemy: How to Fight Fair in Love and Marriage  by Selmer and Conley; encourage the couple to practice these techniques in session and at home. Explore  the family-of-origin history of each partner to discover patterns of destructive intimate relationship interactions that are being repeated in the present relationship; explore characteristics of a healthy relationship. Assist the client in identifying a clear verbal or behavioral signal to be used by either partner to terminate interaction immediately if either senses impending abuse. Explore with the client her multiple roles and responsibilities associated with work and family; clarify related thoughts and feelings. Explore with the client her expectations regarding the balance between work and family, and discuss how this relates to internalized gender role stereotypes. Discuss the client's potential feelings of guilt, shame, and anxiety associated with not meeting internal or external expectations. Assist the client with identifying those expectations that are helpful and those that are potentially harmful; help the client discard those that are causing undue stress and strain and replace them with more realistic messages. Assign the client to read about progressive muscle relaxation and other calming strategies in relevant books or treatment manuals (e.g., Progressive Relaxation Training by Thornell and Elmer; Mastery of Your Anxiety and Optometrist by Venson Richarda Jonne armin Valorie). Assist the client in identifying conflicts that can be addressed using communication, conflict-resolution, and/or problem-solving skills (see Behavioral Marital Therapy by [facility] and Veatrice in the Handbook of Family Therapy, 2nd ed. by Nevelyn and Knickerson [Eds.]). Use behavioral techniques (i.e., education, modeling, role-playing, corrective feedback, positive reinforcement) to teach communication skills, including assertive communication, offering positive feedback, active listening, making positive requests of others for behavior change, and giving negative feedback in an honest and  respectful manner. Explore, along with the client, strategies for making her life more manageable and less stressful (e.g., waking up before the children to exercise or have a cup of tea, pack the children's lunches and backpacks the night before). Encourage the client to develop a schedule of roles and responsibilities with her partner; assign her to meet regularly with her partner to discuss, review, and revise the schedule as needed. Help the client to develop a realistic schedule that outlines the responsibilities of her partner and family members; help the client enlist the commitment of all individuals to the schedule. Refer and encourage the client to join relevant support groups (e.g., single mothers, overextended women, time management). Facilitate the client's development of a social support network to assist with multiple roles and responsibilities; develop a list of active support behaviors (e.g.,  babysitting, help with cleaning). Educate the client about, and encourage her to attend to, healthy eating, sleeping, and exercise. Explore the client's perception of her career success obstacles, including both overt and covert discriminatory practices related to gender, race/ethnicity, religion, and sexual orientation. Validate the client's experience and explore how the work environment impacts distress and dissatisfaction; assist her in listing the three most frustrating aspects of her employment. Teach the client effective problem-solving skills (e.g., clarify the problem, brainstorm solutions, consult with others for additional solutions, list the pros and cons of each solution, select and implement a plan of action, evaluate the consequences, redirect  efforts if necessary) to deal with those aspects of the work environment that limit career development and participation. Reinforce the client's positive, realistic cognitive messages that enhance self-confidence and increase proactive career  pursuits. Refer the client to shadowing or mentorship programs/workshops for aspiring career women. Refer the client to educational seminars, informational meetings, and other related activities to meet other career women and learn career success strategies. Encourage the client to share her feelings of depression to gain an insight into precipitating events and implications of symptoms; normalize her feelings of depression. Encourage the client to describe current and childhood experiences associated with key relationships (e.g., family-of-origin, peer and school relationships, dating relationships, female role models, extended family relationships), roles (e.g., partner, caretaker, worker, Consulting civil engineer), and cultural experiences (e.g., gender roles) that may contribute to depression. Assign the client to participate as fully as possible in a healthy exercise regimen. Assist the client to consider upcoming stressors; brainstorm with her 10 ways to mitigate depression in the future. Encourage the client to continue participation in positive social support systems. Offer the client booster sessions as necessary. Encourage the client to discuss cognitive distortions, including automatic thoughts (e.g., negative view of self, future, experience) and negative schemas (e.g., core beliefs about self and others based on earlier childhood experiences); assess frequency of negative self-statements associated with depression. Educate the client on available options to supplement counseling along with potential implications (e.g., couples and family therapy, support networks, financial support). Reinforce the client's positive, reality-based cognitive messages that enhance self-confidence and increase adaptive action (see Positive Self-Talk in the Adult Psychotherapy Homework Planner, 2nd ed. by Jenniffer). Do behavioral experiments in which depressive automatic thoughts are treated as hypotheses/predictions,  reality-based alternative hypotheses/ predictions are generated, and both are tested against the client's past, present, and/or future experiences.  Diagnosis:Adjustment disorder with mixed anxiety and depressed mood  Plan:   -meet again on Tuesday, March 06, 2024 at [employer].

## 2024-03-02 MED ORDER — VENLAFAXINE HCL ER 75 MG PO CP24: 225.0000 mg | ORAL_CAPSULE | Freq: Every day | ORAL | 1 refills | Status: DC

## 2024-03-02 NOTE — Addendum Note (Signed)
 Addended byBETHA WILLO MINI on: 03/02/2024 07:56 AM   Modules accepted: Orders

## 2024-03-06 ENCOUNTER — Encounter: Payer: Self-pay | Admitting: Professional

## 2024-03-06 ENCOUNTER — Ambulatory Visit (INDEPENDENT_AMBULATORY_CARE_PROVIDER_SITE_OTHER): Admitting: Professional

## 2024-03-06 DIAGNOSIS — F4323 Adjustment disorder with mixed anxiety and depressed mood: Secondary | ICD-10-CM

## 2024-03-06 NOTE — Progress Notes (Signed)
   Kelly Simmons, [facility]

## 2024-03-06 NOTE — Progress Notes (Signed)
 [facility] Counselor/Therapist Progress Note  Patient ID: Kelly Simmons, MRN: [id number],    Date: 03/06/2024  Time Spent: 45 minutes [id number]   Treatment Type: Individual Therapy  Risk Assessment: Danger to Self:  No Self-injurious Behavior: No Danger to Others: No  Subjective: This session was held via Caregility. The patient consented to audio teletherapy and was located in her home during this session. She is aware it is the responsibility of the patient to secure confidentiality on her end of the session. The provider was in a private home office for the duration of this session.    The patient arrived on time for her Caregility appointment.  Issues addressed: 1-marital -pt felt bad and went to sleep but his spouse was upset -she added each night at 9pm to spend an hour together -they now have one hour in their calendar -the spent the time together last evening and went to bed -it made her realize that he does want her attention -she knows that harboring on the past is not helpful -she doesn't feel the need to contact a divorce lawyer -she admits going through the waves of in/out of marriage -lack of sleep co tributes to her  2-professional -overworked -when she comes home from work she continues to work -coworkers are frustrated that she gets to work from home -pt feels she has had to justify more recently -having trouble figuring out  -overwhelmed with work responsibilities -at weekly supervision she is going to identify high, moderate and low tasks  Treatment Plan Problems: Balancing Work and [employer], Career Success Obstacles, Depression, Partner Relational Problems Symptoms: Experiences stress associated with balancing the multiple tasks associated with motherhood and work roles. Experiences role conflict between two or more roles (i.e., obligations or responsibilities for one role interfere with ability to perform duties for other  roles). Describes stress and anxiety associated with cumulative demands of multiple roles. Experiences fatigue and difficulty concentrating (e.g., feeling overwhelmed) due to role overload. Experiences partner relationship strain and dissatisfaction due to inequitable division of responsibilities. Demonstrates disorganization, time management concerns, and an inability to maintain commitments due to role overload. Experiences parenting relationship strain (e.g., increased conflict, low parenting efficacy, child behavioral concerns). Experiences employment dissatisfaction due to limited or no recognition/credit for work efforts or product. Verbalizes feelings of powerlessness or hopelessness due to inequitable salary differentials. Feels lost or disoriented due to lack of mentoring opportunities or access to role models, particularly in female-dominated occupations. Experiences distress and role overload due to a work environment that limits the ability to integrate family responsibilities with career advancement. Demonstrates dysphoric affect (e.g., sadness, hopelessness, helplessness, guilt, shame). Evidences impaired ability to concentrate or make decisions. Reports fatigue or lack of energy. Evidences diminished interest or pleasure in current and future activities. Reports irritability and/or crying spells. Verbalizes difficulty coping adequately with stress and conflict related to multiple roles (e.g., partner, caretaker, worker, Consulting civil engineer). Describes poor interpersonal exchanges (e.g., isolation, loneliness, relationship distress). Experiences dissatisfaction, frustration, and hopelessness with regard to partner relationship. Describes poor communication with partner. Describes a lack of connection with partner, infrequent or no affection, and excessive involvement in activities outside of the relationship to avoid closeness to partner. Verbalizes frustration and conflict in the partner  relationship due to an imbalance in roles and responsibilities (e.g., childcare, home maintenance, work). Goals: Improve depressed mood to maximize effective social, occupational, and physical functioning. Develop the necessary skills for effective, open communication and mutually satisfying intimacy. Develop healthy cognitive mechanisms to  facilitate positive attitudes and beliefs about self within the context of one's environment to mitigate depressive symptoms. Identify and increase intrapersonal, interpersonal, and physical resources to foster positive coping strategies. Increase awareness of own role in relationship conflicts. Develop a sense of own personal power and self-esteem within the relationship. Commit to improve relationship by working together as a team. Paediatric nurse that involve taking a proactive stance in the work environment. Learn and implement various coping skills for dealing with work- and career-related inequalities. Develop assertiveness skills and techniques for application in work-related activities (e.g., establishing boundaries with coworkers, Garment/textile technologist). Explore career options that have been automatically ruled out due to low self-concept or limited opportunities. Develop a support system to assist with multiple roles and responsibilities. Develop a realistic perspective regarding demands and obligations of multiple roles and their completion. Eliminate depressive and anxiety symptoms associated with trying to balance multiple roles. Develop time management strategies to reduce role overload and role conflicts. Objectives target date for all objectives is 01/11/2025: Describe role and responsibilities associated with work and family and related thoughts, feelings, and behaviors. Describe at least three expectations related to work and family obligations and their relationship to gender role socialization and emotional  conflict. Clarify values and priorities. Implement assertiveness skills and limit setting. Learn and implement stress management and relaxation techniques to reduce fatigue, anxiety, and depressive symptoms. Communicate needs with partner regarding multiple role obligations. Learn and implement problem-solving and conflict-resolution skills. Verbalize feelings of distress and dissatisfaction related to obstacles to career success. Implement effective problem-solving skills to deal with a discriminatory work environment. Identify and replace at least three negative self-talk statements that perpetuate feelings of powerlessness or hopelessness. Articulate signs and symptoms of depression in current life experiences. Identify and replace cognitive self-talk that supports depression. Implement relaxation and meditation techniques to manage and process career frustrations. Understand and articulate how symptoms of depression compare to current clinical and research trends of depression for women. Implement behavioral interventions to overcome depression. Implement assertiveness to communicate needs and desires to others. Implement conflict resolution skills to alleviate interpersonal sources of depressed mood. Increase social contacts and communicate needs within existing interpersonal relationships. Increase the level of physical exercise. Express thoughts and feelings regarding the relationship in a direct manner. Both partners identify and verbalize at least three expectations for the relationship. Utilize at least two new conflict-resolution techniques to resolve issues reasonably. Verbalize the feelings associated with grieving the loss of the relationship. Increase time spent in enjoyable contact with the partner. Share family and childhood experiences with each other to increase understanding and empathy. Intervention Direct the client to resources and social opportunities within the  community; assist the client in integrating these resources into a plan to start building new social relationships. Use behavioral techniques (e.g., education, modeling, role-playing, corrective feedback, positive reinforcement) to teach the couple problem-solving and conflict-resolution skills, including defining the problem constructively and specifically, brainstorming options, compromise, choosing options, implementing a plan, and evaluating the results. Teach conflict-resolution techniques such as Do's & Don't's List and Fair Fighting Steps in The Intimate Enemy: How to Fight Fair in Love and Marriage  by Selmer and Conley; encourage the couple to practice these techniques in session and at home. Explore the family-of-origin history of each partner to discover patterns of destructive intimate relationship interactions that are being repeated in the present relationship; explore characteristics of a healthy relationship. Assist the client in identifying a clear verbal or behavioral signal to be  used by either partner to terminate interaction immediately if either senses impending abuse. Explore with the client her multiple roles and responsibilities associated with work and family; clarify related thoughts and feelings. Explore with the client her expectations regarding the balance between work and family, and discuss how this relates to internalized gender role stereotypes. Discuss the client's potential feelings of guilt, shame, and anxiety associated with not meeting internal or external expectations. Assist the client with identifying those expectations that are helpful and those that are potentially harmful; help the client discard those that are causing undue stress and strain and replace them with more realistic messages. Assign the client to read about progressive muscle relaxation and other calming strategies in relevant books or treatment manuals (e.g., Progressive Relaxation Training by  Thornell and Elmer; Mastery of Your Anxiety and Optometrist by Venson Richarda Jonne armin Valorie). Assist the client in identifying conflicts that can be addressed using communication, conflict-resolution, and/or problem-solving skills (see Behavioral Marital Therapy by [facility] and Veatrice in the Handbook of Family Therapy, 2nd ed. by Nevelyn and Knickerson [Eds.]). Use behavioral techniques (i.e., education, modeling, role-playing, corrective feedback, positive reinforcement) to teach communication skills, including assertive communication, offering positive feedback, active listening, making positive requests of others for behavior change, and giving negative feedback in an honest and respectful manner. Explore, along with the client, strategies for making her life more manageable and less stressful (e.g., waking up before the children to exercise or have a cup of tea, pack the children's lunches and backpacks the night before). Encourage the client to develop a schedule of roles and responsibilities with her partner; assign her to meet regularly with her partner to discuss, review, and revise the schedule as needed. Help the client to develop a realistic schedule that outlines the responsibilities of her partner and family members; help the client enlist the commitment of all individuals to the schedule. Refer and encourage the client to join relevant support groups (e.g., single mothers, overextended women, time management). Facilitate the client's development of a social support network to assist with multiple roles and responsibilities; develop a list of active support behaviors (e.g., babysitting, help with cleaning). Educate the client about, and encourage her to attend to, healthy eating, sleeping, and exercise. Explore the client's perception of her career success obstacles, including both overt and covert discriminatory practices related to gender, race/ethnicity,  religion, and sexual orientation. Validate the client's experience and explore how the work environment impacts distress and dissatisfaction; assist her in listing the three most frustrating aspects of her employment. Teach the client effective problem-solving skills (e.g., clarify the problem, brainstorm solutions, consult with others for additional solutions, list the pros and cons of each solution, select and implement a plan of action, evaluate the consequences, redirect  efforts if necessary) to deal with those aspects of the work environment that limit career development and participation. Reinforce the client's positive, realistic cognitive messages that enhance self-confidence and increase proactive career pursuits. Refer the client to shadowing or mentorship programs/workshops for aspiring career women. Refer the client to educational seminars, informational meetings, and other related activities to meet other career women and learn career success strategies. Encourage the client to share her feelings of depression to gain an insight into precipitating events and implications of symptoms; normalize her feelings of depression. Encourage the client to describe current and childhood experiences associated with key relationships (e.g., family-of-origin, peer and school relationships, dating relationships, female role models, extended family relationships), roles (e.g., partner, caretaker, worker, Consulting civil engineer),  and cultural experiences (e.g., gender roles) that may contribute to depression. Assign the client to participate as fully as possible in a healthy exercise regimen. Assist the client to consider upcoming stressors; brainstorm with her 10 ways to mitigate depression in the future. Encourage the client to continue participation in positive social support systems. Offer the client booster sessions as necessary. Encourage the client to discuss cognitive distortions, including automatic thoughts  (e.g., negative view of self, future, experience) and negative schemas (e.g., core beliefs about self and others based on earlier childhood experiences); assess frequency of negative self-statements associated with depression. Educate the client on available options to supplement counseling along with potential implications (e.g., couples and family therapy, support networks, financial support). Reinforce the client's positive, reality-based cognitive messages that enhance self-confidence and increase adaptive action (see Positive Self-Talk in the Adult Psychotherapy Homework Planner, 2nd ed. by Jenniffer). Do behavioral experiments in which depressive automatic thoughts are treated as hypotheses/predictions, reality-based alternative hypotheses/ predictions are generated, and both are tested against the client's past, present, and/or future experiences.  Diagnosis:Adjustment disorder with mixed anxiety and depressed mood  Plan:  -continue working on one hour daily with Camellia -work on determining high moderate and minimally important tasks are -meet again on Tuesday, March 13, 2024 at [employer].

## 2024-03-13 ENCOUNTER — Ambulatory Visit: Admitting: Professional

## 2024-03-13 ENCOUNTER — Encounter: Payer: Self-pay | Admitting: Professional

## 2024-03-13 DIAGNOSIS — F419 Anxiety disorder, unspecified: Secondary | ICD-10-CM | POA: Diagnosis not present

## 2024-03-13 DIAGNOSIS — F32A Depression, unspecified: Secondary | ICD-10-CM | POA: Diagnosis not present

## 2024-03-13 NOTE — Progress Notes (Signed)
 [facility] Counselor/Therapist Progress Note  Patient ID: Kelly Simmons, MRN: [id number],    Date: 03/13/2024  Time Spent: 53 minutes [id number]   Treatment Type: Individual Therapy  Risk Assessment: Danger to Self:  No Self-injurious Behavior: No Danger to Others: No  Subjective: This session was held via Caregility. The patient consented to audio teletherapy and was located in her home during this session. She is aware it is the responsibility of the patient to secure confidentiality on her end of the session. The provider was in a private home office for the duration of this session.    The patient arrived on time for her Caregility appointment.  Issues addressed: 1-homework- completed and ongoing -continue working on one hour daily with Camellia -work on determining high moderate and minimally important tasks are 2-home life is better -a lot more communication to make sure we're taking care of each other's needs -spending our hour is great -using a calendar to make sure each knows what is going on for their family 2-professional a-work has been crazy b-first football game sent first seizure last year -realized she doesn't have the stamina -the day ended up great -challenging setting up donor tailgate event but it ended up great c-a coworker Lamont and I got into into it -she is responsible for the major gift donors and is a former Psychologist, occupational who has a spirit of trying to take over -she was talking bad about pt around donors and many of them are friends -one of the donors contacted her and say to watch out for her and said what she is saying d-Lamont brought up in yesterday's debrief -said it was not set up properly and she had to reorganize -pt tried to move outside of meeting -she then said that she called me -pt addressed her after having tried pulling out in the meeting -conversation was stopped and told to remove from that meeting -pt doesn't trust anyone  there and is the new kid on the block -she has an Nurse, children's who can tell her how she should have reacted e-feels unsure how to manage -her boss has not mentioned what occurred in the staff meeting -she and Lamont are chums -other newer staff, the Runner, broadcasting/film/video, came to her after the meeting and supported her f-supervisor relationship -could use some development -constant interaction with supervisor AD Ned -she has sat down 2-3x's with AD and Lamont g-prolem solving -policies and procedures -written plan for game days with responsible people -email request weekly for anyone required to provide information to Director of Donor Experience -listen to understand not to respond -be intentional  Treatment Plan Problems: Balancing Work and [employer], Career Success Obstacles, Depression, Partner Relational Problems Symptoms: Experiences stress associated with balancing the multiple tasks associated with motherhood and work roles. Experiences role conflict between two or more roles (i.e., obligations or responsibilities for one role interfere with ability to perform duties for other roles). Describes stress and anxiety associated with cumulative demands of multiple roles. Experiences fatigue and difficulty concentrating (e.g., feeling overwhelmed) due to role overload. Experiences partner relationship strain and dissatisfaction due to inequitable division of responsibilities. Demonstrates disorganization, time management concerns, and an inability to maintain commitments due to role overload. Experiences parenting relationship strain (e.g., increased conflict, low parenting efficacy, child behavioral concerns). Experiences employment dissatisfaction due to limited or no recognition/credit for work efforts or product. Verbalizes feelings of powerlessness or hopelessness due to inequitable salary differentials. Feels lost or disoriented due to lack of mentoring  opportunities or  access to role models, particularly in female-dominated occupations. Experiences distress and role overload due to a work environment that limits the ability to integrate family responsibilities with career advancement. Demonstrates dysphoric affect (e.g., sadness, hopelessness, helplessness, guilt, shame). Evidences impaired ability to concentrate or make decisions. Reports fatigue or lack of energy. Evidences diminished interest or pleasure in current and future activities. Reports irritability and/or crying spells. Verbalizes difficulty coping adequately with stress and conflict related to multiple roles (e.g., partner, caretaker, worker, Consulting civil engineer). Describes poor interpersonal exchanges (e.g., isolation, loneliness, relationship distress). Experiences dissatisfaction, frustration, and hopelessness with regard to partner relationship. Describes poor communication with partner. Describes a lack of connection with partner, infrequent or no affection, and excessive involvement in activities outside of the relationship to avoid closeness to partner. Verbalizes frustration and conflict in the partner relationship due to an imbalance in roles and responsibilities (e.g., childcare, home maintenance, work). Goals: Improve depressed mood to maximize effective social, occupational, and physical functioning. Develop the necessary skills for effective, open communication and mutually satisfying intimacy. Develop healthy cognitive mechanisms to facilitate positive attitudes and beliefs about self within the context of one's environment to mitigate depressive symptoms. Identify and increase intrapersonal, interpersonal, and physical resources to foster positive coping strategies. Increase awareness of own role in relationship conflicts. Develop a sense of own personal power and self-esteem within the relationship. Commit to improve relationship by working together as a team. Educational psychologist that involve taking a proactive stance in the work environment. Learn and implement various coping skills for dealing with work- and career-related inequalities. Develop assertiveness skills and techniques for application in work-related activities (e.g., establishing boundaries with coworkers, Garment/textile technologist). Explore career options that have been automatically ruled out due to low self-concept or limited opportunities. Develop a support system to assist with multiple roles and responsibilities. Develop a realistic perspective regarding demands and obligations of multiple roles and their completion. Eliminate depressive and anxiety symptoms associated with trying to balance multiple roles. Develop time management strategies to reduce role overload and role conflicts. Objectives target date for all objectives is 01/11/2025: Describe role and responsibilities associated with work and family and related thoughts, feelings, and behaviors. Describe at least three expectations related to work and family obligations and their relationship to gender role socialization and emotional conflict. Clarify values and priorities. Implement assertiveness skills and limit setting. Learn and implement stress management and relaxation techniques to reduce fatigue, anxiety, and depressive symptoms. Communicate needs with partner regarding multiple role obligations. Learn and implement problem-solving and conflict-resolution skills. Verbalize feelings of distress and dissatisfaction related to obstacles to career success. Implement effective problem-solving skills to deal with a discriminatory work environment. Identify and replace at least three negative self-talk statements that perpetuate feelings of powerlessness or hopelessness. Articulate signs and symptoms of depression in current life experiences. Identify and replace cognitive self-talk that supports depression. Implement relaxation and meditation  techniques to manage and process career frustrations. Understand and articulate how symptoms of depression compare to current clinical and research trends of depression for women. Implement behavioral interventions to overcome depression. Implement assertiveness to communicate needs and desires to others. Implement conflict resolution skills to alleviate interpersonal sources of depressed mood. Increase social contacts and communicate needs within existing interpersonal relationships. Increase the level of physical exercise. Express thoughts and feelings regarding the relationship in a direct manner. Both partners identify and verbalize at least three expectations for the relationship. Utilize at least two new conflict-resolution techniques to  resolve issues reasonably. Verbalize the feelings associated with grieving the loss of the relationship. Increase time spent in enjoyable contact with the partner. Share family and childhood experiences with each other to increase understanding and empathy. Intervention Direct the client to resources and social opportunities within the community; assist the client in integrating these resources into a plan to start building new social relationships. Use behavioral techniques (e.g., education, modeling, role-playing, corrective feedback, positive reinforcement) to teach the couple problem-solving and conflict-resolution skills, including defining the problem constructively and specifically, brainstorming options, compromise, choosing options, implementing a plan, and evaluating the results. Teach conflict-resolution techniques such as Do's & Don't's List and Fair Fighting Steps in The Intimate Enemy: How to Fight Fair in Love and Marriage  by Selmer and Conley; encourage the couple to practice these techniques in session and at home. Explore the family-of-origin history of each partner to discover patterns of destructive intimate relationship interactions that  are being repeated in the present relationship; explore characteristics of a healthy relationship. Assist the client in identifying a clear verbal or behavioral signal to be used by either partner to terminate interaction immediately if either senses impending abuse. Explore with the client her multiple roles and responsibilities associated with work and family; clarify related thoughts and feelings. Explore with the client her expectations regarding the balance between work and family, and discuss how this relates to internalized gender role stereotypes. Discuss the client's potential feelings of guilt, shame, and anxiety associated with not meeting internal or external expectations. Assist the client with identifying those expectations that are helpful and those that are potentially harmful; help the client discard those that are causing undue stress and strain and replace them with more realistic messages. Assign the client to read about progressive muscle relaxation and other calming strategies in relevant books or treatment manuals (e.g., Progressive Relaxation Training by Thornell and Elmer; Mastery of Your Anxiety and Optometrist by Venson Richarda Jonne armin Valorie). Assist the client in identifying conflicts that can be addressed using communication, conflict-resolution, and/or problem-solving skills (see Behavioral Marital Therapy by [facility] and Veatrice in the Handbook of Family Therapy, 2nd ed. by Nevelyn and Knickerson [Eds.]). Use behavioral techniques (i.e., education, modeling, role-playing, corrective feedback, positive reinforcement) to teach communication skills, including assertive communication, offering positive feedback, active listening, making positive requests of others for behavior change, and giving negative feedback in an honest and respectful manner. Explore, along with the client, strategies for making her life more manageable and less stressful (e.g.,  waking up before the children to exercise or have a cup of tea, pack the children's lunches and backpacks the night before). Encourage the client to develop a schedule of roles and responsibilities with her partner; assign her to meet regularly with her partner to discuss, review, and revise the schedule as needed. Help the client to develop a realistic schedule that outlines the responsibilities of her partner and family members; help the client enlist the commitment of all individuals to the schedule. Refer and encourage the client to join relevant support groups (e.g., single mothers, overextended women, time management). Facilitate the client's development of a social support network to assist with multiple roles and responsibilities; develop a list of active support behaviors (e.g., babysitting, help with cleaning). Educate the client about, and encourage her to attend to, healthy eating, sleeping, and exercise. Explore the client's perception of her career success obstacles, including both overt and covert discriminatory practices related to gender, race/ethnicity, religion, and sexual orientation. Validate the client's  experience and explore how the work environment impacts distress and dissatisfaction; assist her in listing the three most frustrating aspects of her employment. Teach the client effective problem-solving skills (e.g., clarify the problem, brainstorm solutions, consult with others for additional solutions, list the pros and cons of each solution, select and implement a plan of action, evaluate the consequences, redirect  efforts if necessary) to deal with those aspects of the work environment that limit career development and participation. Reinforce the client's positive, realistic cognitive messages that enhance self-confidence and increase proactive career pursuits. Refer the client to shadowing or mentorship programs/workshops for aspiring career women. Refer the client to  educational seminars, informational meetings, and other related activities to meet other career women and learn career success strategies. Encourage the client to share her feelings of depression to gain an insight into precipitating events and implications of symptoms; normalize her feelings of depression. Encourage the client to describe current and childhood experiences associated with key relationships (e.g., family-of-origin, peer and school relationships, dating relationships, female role models, extended family relationships), roles (e.g., partner, caretaker, worker, Consulting civil engineer), and cultural experiences (e.g., gender roles) that may contribute to depression. Assign the client to participate as fully as possible in a healthy exercise regimen. Assist the client to consider upcoming stressors; brainstorm with her 10 ways to mitigate depression in the future. Encourage the client to continue participation in positive social support systems. Offer the client booster sessions as necessary. Encourage the client to discuss cognitive distortions, including automatic thoughts (e.g., negative view of self, future, experience) and negative schemas (e.g., core beliefs about self and others based on earlier childhood experiences); assess frequency of negative self-statements associated with depression. Educate the client on available options to supplement counseling along with potential implications (e.g., couples and family therapy, support networks, financial support). Reinforce the client's positive, reality-based cognitive messages that enhance self-confidence and increase adaptive action (see Positive Self-Talk in the Adult Psychotherapy Homework Planner, 2nd ed. by Jenniffer). Do behavioral experiments in which depressive automatic thoughts are treated as hypotheses/predictions, reality-based alternative hypotheses/ predictions are generated, and both are tested against the client's past, present, and/or  future experiences.  Diagnosis:Anxiety and depression  Plan:  -meet again on Tuesday, March 20, 2024 at [employer].

## 2024-03-20 ENCOUNTER — Ambulatory Visit (INDEPENDENT_AMBULATORY_CARE_PROVIDER_SITE_OTHER): Admitting: Professional

## 2024-03-20 ENCOUNTER — Encounter: Payer: Self-pay | Admitting: Professional

## 2024-03-20 DIAGNOSIS — F4323 Adjustment disorder with mixed anxiety and depressed mood: Secondary | ICD-10-CM

## 2024-03-20 NOTE — Progress Notes (Signed)
 [facility] Counselor/Therapist Progress Note  Patient ID: Kelly Simmons, MRN: [id number],    Date: 03/20/2024  Time Spent: 52 minutes 3-352pm   Treatment Type: Individual Therapy  Risk Assessment: Danger to Self:  No Self-injurious Behavior: No Danger to Others: No  Subjective: This session was held via Caregility. The patient consented to audio teletherapy and was located in her home during this session. She is aware it is the responsibility of the patient to secure confidentiality on her end of the session. The provider was in a private home office for the duration of this session.    The patient arrived on time for her Caregility appointment.  Issues addressed: 1-personal  2-professional a-busy weekend at work -[employer] and she was on Recruitment consultant b-issue with Theatre manager -just had a 1:1 with her boss -her boss canceled their 1:1 on Monday -doesn't feel her position has been threatened since she excels -she has had donors and other coworkers comment on her excellence -boss has suggested that she needs to allow [place] to do her job -pt does have too many jobs to do and no support -boss will say we need to do this in 1:1 but does not give pt directive and she feels confused -pt requests direction and more time for supervision and this is not granted -she would like a 1:1 biweekly to touch base with direct honest communication and feedback -she only actually received no 1:1 unless requested, indirection communication (we), asked for clarification of roles and responsibilities and direct goals related to her and she only receives generic departmental goals -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 but it does not feel familiar -pt has previously had direct communication, 1:21 and clear expectations -past issues were from senior level communication and the boss never outlined her expectations -at the time her mom was dying and she  needed direct supervisor communication and not from what her boss said -pt left her job after having twins and they would not approve a hybrid schedule -pt doesn't trust her boss since her boss has talked to her about other leadership completing their jobs c-she got an Agricultural engineer from [employer] -Optometrist required mediation -look at as opportunity to be heard without supervisor presence   Treatment Plan Problems: Balancing Work and [employer], Career Success Obstacles, Depression, Partner Relational Problems Symptoms: Experiences stress associated with balancing the multiple tasks associated with motherhood and work roles. Experiences role conflict between two or more roles (i.e., obligations or responsibilities for one role interfere with ability to perform duties for other roles). Describes stress and anxiety associated with cumulative demands of multiple roles. Experiences fatigue and difficulty concentrating (e.g., feeling overwhelmed) due to role overload. Experiences partner relationship strain and dissatisfaction due to inequitable division of responsibilities. Demonstrates disorganization, time management concerns, and an inability to maintain commitments due to role overload. Experiences parenting relationship strain (e.g., increased conflict, low parenting efficacy, child behavioral concerns). Experiences employment dissatisfaction due to limited or no recognition/credit for work efforts or product. Verbalizes feelings of powerlessness or hopelessness due to inequitable salary differentials. Feels lost or disoriented due to lack of mentoring opportunities or access to role models, particularly in female-dominated occupations. Experiences distress and role overload due to a work environment that limits the ability to integrate family responsibilities with career advancement. Demonstrates dysphoric affect (e.g., sadness, hopelessness, helplessness, guilt, shame). Evidences  impaired ability to concentrate or make decisions. Reports fatigue or lack of energy. Evidences diminished interest or pleasure  in current and future activities. Reports irritability and/or crying spells. Verbalizes difficulty coping adequately with stress and conflict related to multiple roles (e.g., partner, caretaker, worker, Consulting civil engineer). Describes poor interpersonal exchanges (e.g., isolation, loneliness, relationship distress). Experiences dissatisfaction, frustration, and hopelessness with regard to partner relationship. Describes poor communication with partner. Describes a lack of connection with partner, infrequent or no affection, and excessive involvement in activities outside of the relationship to avoid closeness to partner. Verbalizes frustration and conflict in the partner relationship due to an imbalance in roles and responsibilities (e.g., childcare, home maintenance, work). Goals: Improve depressed mood to maximize effective social, occupational, and physical functioning. Develop the necessary skills for effective, open communication and mutually satisfying intimacy. Develop healthy cognitive mechanisms to facilitate positive attitudes and beliefs about self within the context of one's environment to mitigate depressive symptoms. Identify and increase intrapersonal, interpersonal, and physical resources to foster positive coping strategies. Increase awareness of own role in relationship conflicts. Develop a sense of own personal power and self-esteem within the relationship. Commit to improve relationship by working together as a team. Paediatric nurse that involve taking a proactive stance in the work environment. Learn and implement various coping skills for dealing with work- and career-related inequalities. Develop assertiveness skills and techniques for application in work-related activities (e.g., establishing boundaries with coworkers, Teacher, music). Explore career options that have been automatically ruled out due to low self-concept or limited opportunities. Develop a support system to assist with multiple roles and responsibilities. Develop a realistic perspective regarding demands and obligations of multiple roles and their completion. Eliminate depressive and anxiety symptoms associated with trying to balance multiple roles. Develop time management strategies to reduce role overload and role conflicts. Objectives target date for all objectives is 01/11/2025: Describe role and responsibilities associated with work and family and related thoughts, feelings, and behaviors. Describe at least three expectations related to work and family obligations and their relationship to gender role socialization and emotional conflict. Clarify values and priorities. Implement assertiveness skills and limit setting. Learn and implement stress management and relaxation techniques to reduce fatigue, anxiety, and depressive symptoms. Communicate needs with partner regarding multiple role obligations. Learn and implement problem-solving and conflict-resolution skills. Verbalize feelings of distress and dissatisfaction related to obstacles to career success. Implement effective problem-solving skills to deal with a discriminatory work environment. Identify and replace at least three negative self-talk statements that perpetuate feelings of powerlessness or hopelessness. Articulate signs and symptoms of depression in current life experiences. Identify and replace cognitive self-talk that supports depression. Implement relaxation and meditation techniques to manage and process career frustrations. Understand and articulate how symptoms of depression compare to current clinical and research trends of depression for women. Implement behavioral interventions to overcome depression. Implement assertiveness to communicate needs and desires to  others. Implement conflict resolution skills to alleviate interpersonal sources of depressed mood. Increase social contacts and communicate needs within existing interpersonal relationships. Increase the level of physical exercise. Express thoughts and feelings regarding the relationship in a direct manner. Both partners identify and verbalize at least three expectations for the relationship. Utilize at least two new conflict-resolution techniques to resolve issues reasonably. Verbalize the feelings associated with grieving the loss of the relationship. Increase time spent in enjoyable contact with the partner. Share family and childhood experiences with each other to increase understanding and empathy. Intervention Direct the client to resources and social opportunities within the community; assist the client in integrating these resources into a plan to start  building new social relationships. Use behavioral techniques (e.g., education, modeling, role-playing, corrective feedback, positive reinforcement) to teach the couple problem-solving and conflict-resolution skills, including defining the problem constructively and specifically, brainstorming options, compromise, choosing options, implementing a plan, and evaluating the results. Teach conflict-resolution techniques such as Do's & Don't's List and Fair Fighting Steps in The Intimate Enemy: How to Fight Fair in Love and Marriage  by Selmer and Conley; encourage the couple to practice these techniques in session and at home. Explore the family-of-origin history of each partner to discover patterns of destructive intimate relationship interactions that are being repeated in the present relationship; explore characteristics of a healthy relationship. Assist the client in identifying a clear verbal or behavioral signal to be used by either partner to terminate interaction immediately if either senses impending abuse. Explore with the client her  multiple roles and responsibilities associated with work and family; clarify related thoughts and feelings. Explore with the client her expectations regarding the balance between work and family, and discuss how this relates to internalized gender role stereotypes. Discuss the client's potential feelings of guilt, shame, and anxiety associated with not meeting internal or external expectations. Assist the client with identifying those expectations that are helpful and those that are potentially harmful; help the client discard those that are causing undue stress and strain and replace them with more realistic messages. Assign the client to read about progressive muscle relaxation and other calming strategies in relevant books or treatment manuals (e.g., Progressive Relaxation Training by Thornell and Elmer; Mastery of Your Anxiety and Optometrist by Venson Richarda Jonne armin Valorie). Assist the client in identifying conflicts that can be addressed using communication, conflict-resolution, and/or problem-solving skills (see Behavioral Marital Therapy by [facility] and Veatrice in the Handbook of Family Therapy, 2nd ed. by Nevelyn and Knickerson [Eds.]). Use behavioral techniques (i.e., education, modeling, role-playing, corrective feedback, positive reinforcement) to teach communication skills, including assertive communication, offering positive feedback, active listening, making positive requests of others for behavior change, and giving negative feedback in an honest and respectful manner. Explore, along with the client, strategies for making her life more manageable and less stressful (e.g., waking up before the children to exercise or have a cup of tea, pack the children's lunches and backpacks the night before). Encourage the client to develop a schedule of roles and responsibilities with her partner; assign her to meet regularly with her partner to discuss, review, and revise the  schedule as needed. Help the client to develop a realistic schedule that outlines the responsibilities of her partner and family members; help the client enlist the commitment of all individuals to the schedule. Refer and encourage the client to join relevant support groups (e.g., single mothers, overextended women, time management). Facilitate the client's development of a social support network to assist with multiple roles and responsibilities; develop a list of active support behaviors (e.g., babysitting, help with cleaning). Educate the client about, and encourage her to attend to, healthy eating, sleeping, and exercise. Explore the client's perception of her career success obstacles, including both overt and covert discriminatory practices related to gender, race/ethnicity, religion, and sexual orientation. Validate the client's experience and explore how the work environment impacts distress and dissatisfaction; assist her in listing the three most frustrating aspects of her employment. Teach the client effective problem-solving skills (e.g., clarify the problem, brainstorm solutions, consult with others for additional solutions, list the pros and cons of each solution, select and implement a plan of action, evaluate the consequences, redirect  efforts if necessary) to deal with those aspects of the work environment that limit career development and participation. Reinforce the client's positive, realistic cognitive messages that enhance self-confidence and increase proactive career pursuits. Refer the client to shadowing or mentorship programs/workshops for aspiring career women. Refer the client to educational seminars, informational meetings, and other related activities to meet other career women and learn career success strategies. Encourage the client to share her feelings of depression to gain an insight into precipitating events and implications of symptoms; normalize her feelings of  depression. Encourage the client to describe current and childhood experiences associated with key relationships (e.g., family-of-origin, peer and school relationships, dating relationships, female role models, extended family relationships), roles (e.g., partner, caretaker, worker, Consulting civil engineer), and cultural experiences (e.g., gender roles) that may contribute to depression. Assign the client to participate as fully as possible in a healthy exercise regimen. Assist the client to consider upcoming stressors; brainstorm with her 10 ways to mitigate depression in the future. Encourage the client to continue participation in positive social support systems. Offer the client booster sessions as necessary. Encourage the client to discuss cognitive distortions, including automatic thoughts (e.g., negative view of self, future, experience) and negative schemas (e.g., core beliefs about self and others based on earlier childhood experiences); assess frequency of negative self-statements associated with depression. Educate the client on available options to supplement counseling along with potential implications (e.g., couples and family therapy, support networks, financial support). Reinforce the client's positive, reality-based cognitive messages that enhance self-confidence and increase adaptive action (see Positive Self-Talk in the Adult Psychotherapy Homework Planner, 2nd ed. by Jenniffer). Do behavioral experiments in which depressive automatic thoughts are treated as hypotheses/predictions, reality-based alternative hypotheses/ predictions are generated, and both are tested against the client's past, present, and/or future experiences.  Diagnosis:Adjustment disorder with mixed anxiety and depressed mood  Plan:  -meet again on Tuesday, March 27, 2024 at [employer].

## 2024-03-27 ENCOUNTER — Encounter: Payer: Self-pay | Admitting: Professional

## 2024-03-27 ENCOUNTER — Other Ambulatory Visit: Payer: Self-pay | Admitting: Medical-Surgical

## 2024-03-27 ENCOUNTER — Encounter: Admitting: Professional

## 2024-03-27 DIAGNOSIS — R61 Generalized hyperhidrosis: Secondary | ICD-10-CM

## 2024-03-27 NOTE — Progress Notes (Signed)
 A user error has taken place: encounter opened in error, closed for administrative reasons.

## 2024-03-29 ENCOUNTER — Telehealth: Admitting: Physician Assistant

## 2024-03-29 DIAGNOSIS — H109 Unspecified conjunctivitis: Secondary | ICD-10-CM

## 2024-03-29 MED ORDER — OFLOXACIN 0.3 % OP SOLN: 1.0000 [drp] | Freq: Four times a day (QID) | OPHTHALMIC | 0 refills | Status: AC

## 2024-03-29 NOTE — Progress Notes (Signed)

## 2024-03-30 NOTE — Telephone Encounter (Signed)
 Is it okay to send 90 days supply?

## 2024-04-03 ENCOUNTER — Ambulatory Visit (INDEPENDENT_AMBULATORY_CARE_PROVIDER_SITE_OTHER): Admitting: Professional

## 2024-04-03 ENCOUNTER — Encounter: Payer: Self-pay | Admitting: Professional

## 2024-04-03 DIAGNOSIS — F4323 Adjustment disorder with mixed anxiety and depressed mood: Secondary | ICD-10-CM

## 2024-04-03 NOTE — Progress Notes (Signed)
 [facility] Counselor/Therapist Progress Note  Patient ID: Kelly Simmons, MRN: [id number],    Date: 04/03/2024  Time Spent: 49 minutes [id number]   Treatment Type: Individual Therapy  Risk Assessment: Danger to Self:  No Self-injurious Behavior: No Danger to Others: No  Subjective: This session was held via Careg[facility]. The patient consented to audio teletherapy and was located in her home during this session. She is aware it is the responsibility of the patient to secure confidentiality on her end of the session. The provider was in a private home office for the duration of this session.    The patient arrived on time for her Caregility appointment.  Issues addressed: 1-mood -feeling much improved over last session -PHQ-9 04/03/2024    PHQ2-9 Depression Screening   Little interest or pleasure in doing things Several days  Feeling down, depressed, or hopeless Several days  PHQ-2 - Total Score 2  Trouble falling or staying asleep, or sleeping too much Several days  Feeling tired or having little energy Several days  Poor appetite or overeating  Not at all  Feeling bad about yourself - or that you are a failure or have let yourself or your family down Not at all  Trouble concentrating on things, such as reading the newspaper or watching television Several days  Moving or speaking so slowly that other people could have noticed.  Or the opposite - being so fidgety or restless that you have been moving around a lot more than usual Several days  Thoughts that you would be better off dead, or hurting yourself in some way Not at all  PHQ2-9 Total Score 6  If you checked off any problems, how difficult have these problems made it for you to do your work, take care of things at home, or get along with other people Somewhat difficult  Depression Interventions/Treatment Counseling, Medication   GAD- was 15 eight months ago and today is only a 3 -confirmation for pt that she  needs ongoing therapy  2-professional a-HR has never called to schedule the meeting to discuss [place] b-she called HR to try ad get accommodations for her seizure disorder -she requested her PCP to complete ADA accommodations an he turned it in today c-she has been consistent with setting her goals for the day d-Assistant Athletic Director Burns suggested she document all her time -he suggested that she request FMLA and ADA accommodations -her boss has been more supportive since Burns spoke to her -     Treatment Plan Problems: Balancing Work and [employer], Career Success Obstacles, Depression, Grief, Partner Relational Problems Symptoms: Experiences stress associated with balancing the multiple tasks associated with motherhood and work roles. Experiences role conflict between two or more roles (i.e., obligations or responsibilities for one role interfere with ability to perform duties for other roles). Describes stress and anxiety associated with cumulative demands of multiple roles. Experiences fatigue and difficulty concentrating (e.g., feeling overwhelmed) due to role overload. Experiences partner relationship strain and dissatisfaction due to inequitable division of responsibilities. Demonstrates disorganization, time management concerns, and an inability to maintain commitments due to role overload. Experiences parenting relationship strain (e.g., increased conflict, low parenting efficacy, child behavioral concerns). Experiences employment dissatisfaction due to limited or no recognition/credit for work efforts or product. Verbalizes feelings of powerlessness or hopelessness due to inequitable salary differentials. Feels lost or disoriented due to lack of mentoring opportunities or access to role models, particularly in female-dominated occupations. Experiences distress and role overload due to a  work environment that limits the ability to integrate family  responsibilities with career advancement. Demonstrates dysphoric affect (e.g., sadness, hopelessness, helplessness, guilt, shame). Evidences impaired ability to concentrate or make decisions. Reports fatigue or lack of energy. Evidences diminished interest or pleasure in current and future activities. Reports irritability and/or crying spells. Verbalizes difficulty coping adequately with stress and conflict related to multiple roles (e.g., partner, caretaker, worker, Consulting civil engineer). Describes poor interpersonal exchanges (e.g., isolation, loneliness, relationship distress). Experiences dissatisfaction, frustration, and hopelessness with regard to partner relationship. Describes poor communication with partner. Describes a lack of connection with partner, infrequent or no affection, and excessive involvement in activities outside of the relationship to avoid closeness to partner. Verbalizes frustration and conflict in the partner relationship due to an imbalance in roles and responsibilities (e.g., childcare, home maintenance, work). Goals: Improve depressed mood to maximize effective social, occupational, and physical functioning. Develop the necessary skills for effective, open communication and mutually satisfying intimacy. Develop healthy cognitive mechanisms to facilitate positive attitudes and beliefs about self within the context of one's environment to mitigate depressive symptoms. Identify and increase intrapersonal, interpersonal, and physical resources to foster positive coping strategies. Increase awareness of own role in relationship conflicts. Develop a sense of own personal power and self-esteem within the relationship. Commit to improve relationship by working together as a team. Paediatric nurse that involve taking a proactive stance in the work environment. Learn and implement various coping skills for dealing with work- and career-related inequalities. Develop  assertiveness skills and techniques for application in work-related activities (e.g., establishing boundaries with coworkers, Garment/textile technologist). Explore career options that have been automatically ruled out due to low self-concept or limited opportunities. Develop a support system to assist with multiple roles and responsibilities. Develop a realistic perspective regarding demands and obligations of multiple roles and their completion. Eliminate depressive and anxiety symptoms associated with trying to balance multiple roles. Develop time management strategies to reduce role overload and role conflicts. Objectives target date for all objectives is 01/11/2025: Describe role and responsibilities associated with work and family and related thoughts, feelings, and behaviors.   70% Describe at least three expectations related to work and family obligations and their relationship to gender role socialization and emotional conflict.   90% Clarify values and priorities.   80% Implement assertiveness skills and limit setting.   70% Learn and implement stress management and relaxation techniques to reduce fatigue, anxiety, and depressive symptoms.   60% Communicate needs with partner regarding multiple role obligations.   70% Learn and implement problem-solving and conflict-resolution skills.   50% Verbalize feelings of distress and dissatisfaction related to obstacles to career success. 50% Implement effective problem-solving skills to deal with a discriminatory work environment.   60% Identify and replace at least three negative self-talk statements that perpetuate feelings of powerlessness or hopelessness.   60% Articulate signs and symptoms of depression in current life experiences.   70% Identify and replace cognitive self-talk that supports depression.   70% Implement relaxation and meditation techniques to manage and process career frustrations.   60% Understand and articulate how symptoms of  depression compare to current clinical and research trends of depression for women.   70% Implement behavioral interventions to overcome depression.   50% Implement assertiveness to communicate needs and desires to others.   60% Implement conflict resolution skills to alleviate interpersonal sources of depressed mood.   50% Increase social contacts and communicate needs within existing interpersonal relationships.   60% Increase the level  of physical exercise.   60% Express thoughts and feelings regarding the relationship in a direct manner.   60% Both partners identify and verbalize at least three expectations for the relationship. 50% Utilize at least two new conflict-resolution techniques to resolve issues reasonably. 60% Verbalize the feelings associated with grieving the loss of the relationship.   50% Increase time spent in enjoyable contact with the partner.   60% Share family and childhood experiences with each other to increase understanding and empathy.   60% Intervention Direct the client to resources and social opportunities within the community; assist the client in integrating these resources into a plan to start building new social relationships. Use behavioral techniques (e.g., education, modeling, role-playing, corrective feedback, positive reinforcement) to teach the couple problem-solving and conflict-resolution skills, including defining the problem constructively and specifically, brainstorming options, compromise, choosing options, implementing a plan, and evaluating the results. Teach conflict-resolution techniques such as Do's & Don't's List and Fair Fighting Steps in The Intimate Enemy: How to Fight Fair in Love and Marriage  by Selmer and Conley; encourage the couple to practice these techniques in session and at home. Explore the family-of-origin history of each partner to discover patterns of destructive intimate relationship interactions that are being repeated in the  present relationship; explore characteristics of a healthy relationship. Assist the client in identifying a clear verbal or behavioral signal to be used by either partner to terminate interaction immediately if either senses impending abuse. Explore with the client her multiple roles and responsibilities associated with work and family; clarify related thoughts and feelings. Explore with the client her expectations regarding the balance between work and family, and discuss how this relates to internalized gender role stereotypes. Discuss the client's potential feelings of guilt, shame, and anxiety associated with not meeting internal or external expectations. Assist the client with identifying those expectations that are helpful and those that are potentially harmful; help the client discard those that are causing undue stress and strain and replace them with more realistic messages. Assign the client to read about progressive muscle relaxation and other calming strategies in relevant books or treatment manuals (e.g., Progressive Relaxation Training by Thornell and Elmer; Mastery of Your Anxiety and Optometrist by Venson Richarda Jonne armin Valorie). Assist the client in identifying conflicts that can be addressed using communication, conflict-resolution, and/or problem-solving skills (see Behavioral Marital Therapy by [facility] and Veatrice in the Handbook of Family Therapy, 2nd ed. by Nevelyn and Knickerson [Eds.]). Use behavioral techniques (i.e., education, modeling, role-playing, corrective feedback, positive reinforcement) to teach communication skills, including assertive communication, offering positive feedback, active listening, making positive requests of others for behavior change, and giving negative feedback in an honest and respectful manner. Explore, along with the client, strategies for making her life more manageable and less stressful (e.g., waking up before the  children to exercise or have a cup of tea, pack the children's lunches and backpacks the night before). Encourage the client to develop a schedule of roles and responsibilities with her partner; assign her to meet regularly with her partner to discuss, review, and revise the schedule as needed. Help the client to develop a realistic schedule that outlines the responsibilities of her partner and family members; help the client enlist the commitment of all individuals to the schedule. Refer and encourage the client to join relevant support groups (e.g., single mothers, overextended women, time management). Facilitate the client's development of a social support network to assist with multiple roles and responsibilities; develop a list  of active support behaviors (e.g., babysitting, help with cleaning). Educate the client about, and encourage her to attend to, healthy eating, sleeping, and exercise. Explore the client's perception of her career success obstacles, including both overt and covert discriminatory practices related to gender, race/ethnicity, religion, and sexual orientation. Validate the client's experience and explore how the work environment impacts distress and dissatisfaction; assist her in listing the three most frustrating aspects of her employment. Teach the client effective problem-solving skills (e.g., clarify the problem, brainstorm solutions, consult with others for additional solutions, list the pros and cons of each solution, select and implement a plan of action, evaluate the consequences, redirect  efforts if necessary) to deal with those aspects of the work environment that limit career development and participation. Reinforce the client's positive, realistic cognitive messages that enhance self-confidence and increase proactive career pursuits. Refer the client to shadowing or mentorship programs/workshops for aspiring career women. Refer the client to educational seminars,  informational meetings, and other related activities to meet other career women and learn career success strategies. Encourage the client to share her feelings of depression to gain an insight into precipitating events and implications of symptoms; normalize her feelings of depression. Encourage the client to describe current and childhood experiences associated with key relationships (e.g., family-of-origin, peer and school relationships, dating relationships, female role models, extended family relationships), roles (e.g., partner, caretaker, worker, Consulting civil engineer), and cultural experiences (e.g., gender roles) that may contribute to depression. Assign the client to participate as fully as possible in a healthy exercise regimen. Assist the client to consider upcoming stressors; brainstorm with her 10 ways to mitigate depression in the future. Encourage the client to continue participation in positive social support systems. Offer the client booster sessions as necessary. Encourage the client to discuss cognitive distortions, including automatic thoughts (e.g., negative view of self, future, experience) and negative schemas (e.g., core beliefs about self and others based on earlier childhood experiences); assess frequency of negative self-statements associated with depression. Educate the client on available options to supplement counseling along with potential implications (e.g., couples and family therapy, support networks, financial support). Reinforce the client's positive, reality-based cognitive messages that enhance self-confidence and increase adaptive action (see Positive Self-Talk in the Adult Psychotherapy Homework Planner, 2nd ed. by Jenniffer). Do behavioral experiments in which depressive automatic thoughts are treated as hypotheses/predictions, reality-based alternative hypotheses/ predictions are generated, and both are tested against the client's past, present, and/or future  experiences.  Diagnosis:Adjustment disorder with mixed anxiety and depressed mood  Plan:  -meet again on Tuesday, April 10, 2024 at [employer].

## 2024-04-10 ENCOUNTER — Encounter: Payer: Self-pay | Admitting: Professional

## 2024-04-10 ENCOUNTER — Ambulatory Visit (INDEPENDENT_AMBULATORY_CARE_PROVIDER_SITE_OTHER): Admitting: Professional

## 2024-04-10 DIAGNOSIS — F4323 Adjustment disorder with mixed anxiety and depressed mood: Secondary | ICD-10-CM | POA: Diagnosis not present

## 2024-04-10 NOTE — Progress Notes (Signed)
 [facility] Counselor/Therapist Progress Note  Patient ID: Kelly Simmons, MRN: [id number],    Date: 04/10/2024  Time Spent: 55 minutes [id number]   Treatment Type: Individual Therapy  Risk Assessment: Danger to Self:  No Self-injurious Behavior: No Danger to Others: No  Subjective: This session was held via Caregility. The patient consented to audio teletherapy and was located in her home during this session. She is aware it is the responsibility of the patient to secure confidentiality on her end of the session. The provider was in a private home office for the duration of this session.    The patient arrived on time for her Caregility appointment.  Issues addressed: 1-professional a-pt has had a challenging week at work due to a difficult donor -one donor is very condescending toward women -she was so angry that she went to her office and cried -she admits that her father was condescending and she was never good enough -gave alternative possibilities and pt felt like being assertive was punking out 2-anger -origin -triggers -how to manage  Treatment Plan Problems: Balancing Work and [employer], Career Success Obstacles, Depression, Grief, Partner Relational Problems Symptoms: Experiences stress associated with balancing the multiple tasks associated with motherhood and work roles. Experiences role conflict between two or more roles (i.e., obligations or responsibilities for one role interfere with ability to perform duties for other roles). Describes stress and anxiety associated with cumulative demands of multiple roles. Experiences fatigue and difficulty concentrating (e.g., feeling overwhelmed) due to role overload. Experiences partner relationship strain and dissatisfaction due to inequitable division of responsibilities. Demonstrates disorganization, time management concerns, and an inability to maintain commitments due to role  overload. Experiences parenting relationship strain (e.g., increased conflict, low parenting efficacy, child behavioral concerns). Experiences employment dissatisfaction due to limited or no recognition/credit for work efforts or product. Verbalizes feelings of powerlessness or hopelessness due to inequitable salary differentials. Feels lost or disoriented due to lack of mentoring opportunities or access to role models, particularly in female-dominated occupations. Experiences distress and role overload due to a work environment that limits the ability to integrate family responsibilities with career advancement. Demonstrates dysphoric affect (e.g., sadness, hopelessness, helplessness, guilt, shame). Evidences impaired ability to concentrate or make decisions. Reports fatigue or lack of energy. Evidences diminished interest or pleasure in current and future activities. Reports irritability and/or crying spells. Verbalizes difficulty coping adequately with stress and conflict related to multiple roles (e.g., partner, caretaker, worker, Consulting civil engineer). Describes poor interpersonal exchanges (e.g., isolation, loneliness, relationship distress). Experiences dissatisfaction, frustration, and hopelessness with regard to partner relationship. Describes poor communication with partner. Describes a lack of connection with partner, infrequent or no affection, and excessive involvement in activities outside of the relationship to avoid closeness to partner. Verbalizes frustration and conflict in the partner relationship due to an imbalance in roles and responsibilities (e.g., childcare, home maintenance, work). Goals: Improve depressed mood to maximize effective social, occupational, and physical functioning. Develop the necessary skills for effective, open communication and mutually satisfying intimacy. Develop healthy cognitive mechanisms to facilitate positive attitudes and beliefs about self within the  context of one's environment to mitigate depressive symptoms. Identify and increase intrapersonal, interpersonal, and physical resources to foster positive coping strategies. Increase awareness of own role in relationship conflicts. Develop a sense of own personal power and self-esteem within the relationship. Commit to improve relationship by working together as a team. Paediatric nurse that involve taking a proactive stance in the work environment. Learn and implement various  coping skills for dealing with work- and career-related inequalities. Develop assertiveness skills and techniques for application in work-related activities (e.g., establishing boundaries with coworkers, Garment/textile technologist). Explore career options that have been automatically ruled out due to low self-concept or limited opportunities. Develop a support system to assist with multiple roles and responsibilities. Develop a realistic perspective regarding demands and obligations of multiple roles and their completion. Eliminate depressive and anxiety symptoms associated with trying to balance multiple roles. Develop time management strategies to reduce role overload and role conflicts. Objectives target date for all objectives is 01/11/2025: Describe role and responsibilities associated with work and family and related thoughts, feelings, and behaviors.   70% Describe at least three expectations related to work and family obligations and their relationship to gender role socialization and emotional conflict.   90% Clarify values and priorities.   80% Implement assertiveness skills and limit setting.   70% Learn and implement stress management and relaxation techniques to reduce fatigue, anxiety, and depressive symptoms.   60% Communicate needs with partner regarding multiple role obligations.   70% Learn and implement problem-solving and conflict-resolution skills.   50% Verbalize feelings of distress  and dissatisfaction related to obstacles to career success. 50% Implement effective problem-solving skills to deal with a discriminatory work environment.   60% Identify and replace at least three negative self-talk statements that perpetuate feelings of powerlessness or hopelessness.   60% Articulate signs and symptoms of depression in current life experiences.   70% Identify and replace cognitive self-talk that supports depression.   70% Implement relaxation and meditation techniques to manage and process career frustrations.   60% Understand and articulate how symptoms of depression compare to current clinical and research trends of depression for women.   70% Implement behavioral interventions to overcome depression.   50% Implement assertiveness to communicate needs and desires to others.   60% Implement conflict resolution skills to alleviate interpersonal sources of depressed mood.   50% Increase social contacts and communicate needs within existing interpersonal relationships.   60% Increase the level of physical exercise.   60% Express thoughts and feelings regarding the relationship in a direct manner.   60% Both partners identify and verbalize at least three expectations for the relationship. 50% Utilize at least two new conflict-resolution techniques to resolve issues reasonably. 60% Verbalize the feelings associated with grieving the loss of the relationship.   50% Increase time spent in enjoyable contact with the partner.   60% Share family and childhood experiences with each other to increase understanding and empathy.   60% Intervention Direct the client to resources and social opportunities within the community; assist the client in integrating these resources into a plan to start building new social relationships. Use behavioral techniques (e.g., education, modeling, role-playing, corrective feedback, positive reinforcement) to teach the couple problem-solving and  conflict-resolution skills, including defining the problem constructively and specifically, brainstorming options, compromise, choosing options, implementing a plan, and evaluating the results. Teach conflict-resolution techniques such as Do's & Don't's List and Fair Fighting Steps in The Intimate Enemy: How to Fight Fair in Love and Marriage  by Selmer and Conley; encourage the couple to practice these techniques in session and at home. Explore the family-of-origin history of each partner to discover patterns of destructive intimate relationship interactions that are being repeated in the present relationship; explore characteristics of a healthy relationship. Assist the client in identifying a clear verbal or behavioral signal to be used by either partner to terminate interaction immediately if either  senses impending abuse. Explore with the client her multiple roles and responsibilities associated with work and family; clarify related thoughts and feelings. Explore with the client her expectations regarding the balance between work and family, and discuss how this relates to internalized gender role stereotypes. Discuss the client's potential feelings of guilt, shame, and anxiety associated with not meeting internal or external expectations. Assist the client with identifying those expectations that are helpful and those that are potentially harmful; help the client discard those that are causing undue stress and strain and replace them with more realistic messages. Assign the client to read about progressive muscle relaxation and other calming strategies in relevant books or treatment manuals (e.g., Progressive Relaxation Training by Thornell and Elmer; Mastery of Your Anxiety and Optometrist by Venson Richarda Jonne armin Valorie). Assist the client in identifying conflicts that can be addressed using communication, conflict-resolution, and/or problem-solving skills (see Behavioral  Marital Therapy by [facility] and Veatrice in the Handbook of Family Therapy, 2nd ed. by Nevelyn and Knickerson [Eds.]). Use behavioral techniques (i.e., education, modeling, role-playing, corrective feedback, positive reinforcement) to teach communication skills, including assertive communication, offering positive feedback, active listening, making positive requests of others for behavior change, and giving negative feedback in an honest and respectful manner. Explore, along with the client, strategies for making her life more manageable and less stressful (e.g., waking up before the children to exercise or have a cup of tea, pack the children's lunches and backpacks the night before). Encourage the client to develop a schedule of roles and responsibilities with her partner; assign her to meet regularly with her partner to discuss, review, and revise the schedule as needed. Help the client to develop a realistic schedule that outlines the responsibilities of her partner and family members; help the client enlist the commitment of all individuals to the schedule. Refer and encourage the client to join relevant support groups (e.g., single mothers, overextended women, time management). Facilitate the client's development of a social support network to assist with multiple roles and responsibilities; develop a list of active support behaviors (e.g., babysitting, help with cleaning). Educate the client about, and encourage her to attend to, healthy eating, sleeping, and exercise. Explore the client's perception of her career success obstacles, including both overt and covert discriminatory practices related to gender, race/ethnicity, religion, and sexual orientation. Validate the client's experience and explore how the work environment impacts distress and dissatisfaction; assist her in listing the three most frustrating aspects of her employment. Teach the client effective problem-solving skills  (e.g., clarify the problem, brainstorm solutions, consult with others for additional solutions, list the pros and cons of each solution, select and implement a plan of action, evaluate the consequences, redirect  efforts if necessary) to deal with those aspects of the work environment that limit career development and participation. Reinforce the client's positive, realistic cognitive messages that enhance self-confidence and increase proactive career pursuits. Refer the client to shadowing or mentorship programs/workshops for aspiring career women. Refer the client to educational seminars, informational meetings, and other related activities to meet other career women and learn career success strategies. Encourage the client to share her feelings of depression to gain an insight into precipitating events and implications of symptoms; normalize her feelings of depression. Encourage the client to describe current and childhood experiences associated with key relationships (e.g., family-of-origin, peer and school relationships, dating relationships, female role models, extended family relationships), roles (e.g., partner, caretaker, worker, Consulting civil engineer), and cultural experiences (e.g., gender roles) that may contribute to  depression. Assign the client to participate as fully as possible in a healthy exercise regimen. Assist the client to consider upcoming stressors; brainstorm with her 10 ways to mitigate depression in the future. Encourage the client to continue participation in positive social support systems. Offer the client booster sessions as necessary. Encourage the client to discuss cognitive distortions, including automatic thoughts (e.g., negative view of self, future, experience) and negative schemas (e.g., core beliefs about self and others based on earlier childhood experiences); assess frequency of negative self-statements associated with depression. Educate the client on available options to  supplement counseling along with potential implications (e.g., couples and family therapy, support networks, financial support). Reinforce the client's positive, reality-based cognitive messages that enhance self-confidence and increase adaptive action (see Positive Self-Talk in the Adult Psychotherapy Homework Planner, 2nd ed. by Jenniffer). Do behavioral experiments in which depressive automatic thoughts are treated as hypotheses/predictions, reality-based alternative hypotheses/ predictions are generated, and both are tested against the client's past, present, and/or future experiences.  Diagnosis:Adjustment disorder with mixed anxiety and depressed mood  Plan:  -meet again on Tuesday, April 17, 2024 at [employer].

## 2024-04-13 ENCOUNTER — Telehealth

## 2024-04-17 ENCOUNTER — Ambulatory Visit: Admitting: Professional

## 2024-04-22 ENCOUNTER — Other Ambulatory Visit: Payer: Self-pay | Admitting: Medical-Surgical

## 2024-05-01 ENCOUNTER — Ambulatory Visit: Admitting: Professional

## 2024-05-03 ENCOUNTER — Encounter: Payer: Self-pay | Admitting: Professional

## 2024-05-03 ENCOUNTER — Ambulatory Visit (INDEPENDENT_AMBULATORY_CARE_PROVIDER_SITE_OTHER): Admitting: Professional

## 2024-05-03 DIAGNOSIS — F4323 Adjustment disorder with mixed anxiety and depressed mood: Secondary | ICD-10-CM | POA: Diagnosis not present

## 2024-05-03 NOTE — Progress Notes (Signed)
  Behavioral Health Counselor/Therapist Progress Note  Patient ID: Kelly Simmons, MRN: [id number],    Date: 05/03/2024  Time Spent: 45 minutes [id number]   Treatment Type: Individual Therapy  Risk Assessment: Danger to Self:  No Self-injurious Behavior: No Danger to Others: No  Subjective: This session was held via Careg[facility]. The patient consented to audio teletherapy and was located in her home during this session. She is aware it is the responsibility of the patient to secure confidentiality on her end of the session. The provider was in a private home office for the duration of this session.    The patient arrived on time for her Caregility appointment.  Issues addressed: 1-professional a-pt feels relief since her boss resigned b-she is now reporting to [place] -she is no longer  2-her aunt by marriage passed away-pt feels like her mourning is different -pt reports having had a really good cry and looked at pictures -she is concerned for her uncle's wellbeing 3-husband [place] -pt and her husband decided they don't know what they're doing but they need to do it better -pt admits that she believes she has released her feelings related to the infidelity  -prioritizing her marriage -communicating clearly -listen to understand -reflecting on what they used to enjoy and consider discussing what they can do moving forward 4-consider Gottman Therapy for couples work -educated on modality -provided resources  Treatment Plan Problems: Balancing Work and [employer], Estate Manager/land Agent Obstacles, Depression, Grief, Partner Relational Problems Symptoms: Experiences stress associated with balancing the multiple tasks associated with motherhood and work roles. Experiences role conflict between two or more roles (i.e., obligations or responsibilities for one role interfere with ability to perform duties for other roles). Describes stress and anxiety associated with  cumulative demands of multiple roles. Experiences fatigue and difficulty concentrating (e.g., feeling overwhelmed) due to role overload. Experiences partner relationship strain and dissatisfaction due to inequitable division of responsibilities. Demonstrates disorganization, time management concerns, and an inability to maintain commitments due to role overload. Experiences parenting relationship strain (e.g., increased conflict, low parenting efficacy, child behavioral concerns). Experiences employment dissatisfaction due to limited or no recognition/credit for work efforts or product. Verbalizes feelings of powerlessness or hopelessness due to inequitable salary differentials. Feels lost or disoriented due to lack of mentoring opportunities or access to role models, particularly in female-dominated occupations. Experiences distress and role overload due to a work environment that limits the ability to integrate family responsibilities with career advancement. Demonstrates dysphoric affect (e.g., sadness, hopelessness, helplessness, guilt, shame). Evidences impaired ability to concentrate or make decisions. Reports fatigue or lack of energy. Evidences diminished interest or pleasure in current and future activities. Reports irritability and/or crying spells. Verbalizes difficulty coping adequately with stress and conflict related to multiple roles (e.g., partner, caretaker, worker, consulting civil engineer). Describes poor interpersonal exchanges (e.g., isolation, loneliness, relationship distress). Experiences dissatisfaction, frustration, and hopelessness with regard to partner relationship. Describes poor communication with partner. Describes a lack of connection with partner, infrequent or no affection, and excessive involvement in activities outside of the relationship to avoid closeness to partner. Verbalizes frustration and conflict in the partner relationship due to an imbalance in roles and  responsibilities (e.g., childcare, home maintenance, work). Goals: Improve depressed mood to maximize effective social, occupational, and physical functioning. Develop the necessary skills for effective, open communication and mutually satisfying intimacy. Develop healthy cognitive mechanisms to facilitate positive attitudes and beliefs about self within the context of one's environment to mitigate depressive symptoms. Identify and increase intrapersonal, interpersonal, and  physical resources to foster positive coping strategies. Increase awareness of own role in relationship conflicts. Develop a sense of own personal power and self-esteem within the relationship. Commit to improve relationship by working together as a team. Paediatric nurse that involve taking a proactive stance in the work environment. Learn and implement various coping skills for dealing with work- and career-related inequalities. Develop assertiveness skills and techniques for application in work-related activities (e.g., establishing boundaries with coworkers, garment/textile technologist). Explore career options that have been automatically ruled out due to low self-concept or limited opportunities. Develop a support system to assist with multiple roles and responsibilities. Develop a realistic perspective regarding demands and obligations of multiple roles and their completion. Eliminate depressive and anxiety symptoms associated with trying to balance multiple roles. Develop time management strategies to reduce role overload and role conflicts. Objectives target date for all objectives is 01/11/2025: Describe role and responsibilities associated with work and family and related thoughts, feelings, and behaviors.   70% Describe at least three expectations related to work and family obligations and their relationship to gender role socialization and emotional conflict.   90% Clarify values and priorities.    80% Implement assertiveness skills and limit setting.   70% Learn and implement stress management and relaxation techniques to reduce fatigue, anxiety, and depressive symptoms.   60% Communicate needs with partner regarding multiple role obligations.   70% Learn and implement problem-solving and conflict-resolution skills.   50% Verbalize feelings of distress and dissatisfaction related to obstacles to career success. 50% Implement effective problem-solving skills to deal with a discriminatory work environment.   60% Identify and replace at least three negative self-talk statements that perpetuate feelings of powerlessness or hopelessness.   60% Articulate signs and symptoms of depression in current life experiences.   70% Identify and replace cognitive self-talk that supports depression.   70% Implement relaxation and meditation techniques to manage and process career frustrations.   60% Understand and articulate how symptoms of depression compare to current clinical and research trends of depression for women.   70% Implement behavioral interventions to overcome depression.   50% Implement assertiveness to communicate needs and desires to others.   60% Implement conflict resolution skills to alleviate interpersonal sources of depressed mood.   50% Increase social contacts and communicate needs within existing interpersonal relationships.   60% Increase the level of physical exercise.   60% Express thoughts and feelings regarding the relationship in a direct manner.   60% Both partners identify and verbalize at least three expectations for the relationship. 50% Utilize at least two new conflict-resolution techniques to resolve issues reasonably. 60% Verbalize the feelings associated with grieving the loss of the relationship.   50% Increase time spent in enjoyable contact with the partner.   60% Share family and childhood experiences with each other to increase understanding and empathy.    60% Intervention Direct the client to resources and social opportunities within the community; assist the client in integrating these resources into a plan to start building new social relationships. Use behavioral techniques (e.g., education, modeling, role-playing, corrective feedback, positive reinforcement) to teach the couple problem-solving and conflict-resolution skills, including defining the problem constructively and specifically, brainstorming options, compromise, choosing options, implementing a plan, and evaluating the results. Teach conflict-resolution techniques such as Do's & Don't's List and Fair Fighting Steps in The Intimate Enemy: How to Fight Fair in Love and Marriage  by Selmer and Conley; encourage the couple to practice these techniques in  session and at home. Explore the family-of-origin history of each partner to discover patterns of destructive intimate relationship interactions that are being repeated in the present relationship; explore characteristics of a healthy relationship. Assist the client in identifying a clear verbal or behavioral signal to be used by either partner to terminate interaction immediately if either senses impending abuse. Explore with the client her multiple roles and responsibilities associated with work and family; clarify related thoughts and feelings. Explore with the client her expectations regarding the balance between work and family, and discuss how this relates to internalized gender role stereotypes. Discuss the client's potential feelings of guilt, shame, and anxiety associated with not meeting internal or external expectations. Assist the client with identifying those expectations that are helpful and those that are potentially harmful; help the client discard those that are causing undue stress and strain and replace them with more realistic messages. Assign the client to read about progressive muscle relaxation and other calming strategies  in relevant books or treatment manuals (e.g., Progressive Relaxation Training by Thornell and Elmer; Mastery of Your Anxiety and Optometrist by Venson Richarda Jonne armin Valorie). Assist the client in identifying conflicts that can be addressed using communication, conflict-resolution, and/or problem-solving skills (see Behavioral Marital Therapy by [facility] and Veatrice in the Handbook of Family Therapy, 2nd ed. by Nevelyn and Knickerson [Eds.]). Use behavioral techniques (i.e., education, modeling, role-playing, corrective feedback, positive reinforcement) to teach communication skills, including assertive communication, offering positive feedback, active listening, making positive requests of others for behavior change, and giving negative feedback in an honest and respectful manner. Explore, along with the client, strategies for making her life more manageable and less stressful (e.g., waking up before the children to exercise or have a cup of tea, pack the children's lunches and backpacks the night before). Encourage the client to develop a schedule of roles and responsibilities with her partner; assign her to meet regularly with her partner to discuss, review, and revise the schedule as needed. Help the client to develop a realistic schedule that outlines the responsibilities of her partner and family members; help the client enlist the commitment of all individuals to the schedule. Refer and encourage the client to join relevant support groups (e.g., single mothers, overextended women, time management). Facilitate the client's development of a social support network to assist with multiple roles and responsibilities; develop a list of active support behaviors (e.g., babysitting, help with cleaning). Educate the client about, and encourage her to attend to, healthy eating, sleeping, and exercise. Explore the client's perception of her career success obstacles, including both  overt and covert discriminatory practices related to gender, race/ethnicity, religion, and sexual orientation. Validate the client's experience and explore how the work environment impacts distress and dissatisfaction; assist her in listing the three most frustrating aspects of her employment. Teach the client effective problem-solving skills (e.g., clarify the problem, brainstorm solutions, consult with others for additional solutions, list the pros and cons of each solution, select and implement a plan of action, evaluate the consequences, redirect  efforts if necessary) to deal with those aspects of the work environment that limit career development and participation. Reinforce the client's positive, realistic cognitive messages that enhance self-confidence and increase proactive career pursuits. Refer the client to shadowing or mentorship programs/workshops for aspiring career women. Refer the client to educational seminars, informational meetings, and other related activities to meet other career women and learn career success strategies. Encourage the client to share her feelings of depression to gain an  insight into precipitating events and implications of symptoms; normalize her feelings of depression. Encourage the client to describe current and childhood experiences associated with key relationships (e.g., family-of-origin, peer and school relationships, dating relationships, female role models, extended family relationships), roles (e.g., partner, caretaker, worker, consulting civil engineer), and cultural experiences (e.g., gender roles) that may contribute to depression. Assign the client to participate as fully as possible in a healthy exercise regimen. Assist the client to consider upcoming stressors; brainstorm with her 10 ways to mitigate depression in the future. Encourage the client to continue participation in positive social support systems. Offer the client booster sessions as necessary. Encourage  the client to discuss cognitive distortions, including automatic thoughts (e.g., negative view of self, future, experience) and negative schemas (e.g., core beliefs about self and others based on earlier childhood experiences); assess frequency of negative self-statements associated with depression. Educate the client on available options to supplement counseling along with potential implications (e.g., couples and family therapy, support networks, financial support). Reinforce the client's positive, reality-based cognitive messages that enhance self-confidence and increase adaptive action (see Positive Self-Talk in the Adult Psychotherapy Homework Planner, 2nd ed. by Jenniffer). Do behavioral experiments in which depressive automatic thoughts are treated as hypotheses/predictions, reality-based alternative hypotheses/ predictions are generated, and both are tested against the client's past, present, and/or future experiences.  Diagnosis:Adjustment disorder with mixed anxiety and depressed mood  Plan:  -meet again on Tuesday, May 15, 2024 at [employer].

## 2024-05-05 ENCOUNTER — Other Ambulatory Visit: Payer: Self-pay | Admitting: Medical-Surgical

## 2024-05-15 ENCOUNTER — Encounter: Payer: Self-pay | Admitting: Professional

## 2024-05-15 ENCOUNTER — Ambulatory Visit (INDEPENDENT_AMBULATORY_CARE_PROVIDER_SITE_OTHER): Admitting: Professional

## 2024-05-15 DIAGNOSIS — F4323 Adjustment disorder with mixed anxiety and depressed mood: Secondary | ICD-10-CM

## 2024-05-15 NOTE — Progress Notes (Signed)
 [facility] Counselor/Therapist Progress Note  Patient ID: Kelly Simmons, MRN: [id number],    Date: 05/15/2024  Time Spent: 2 minutes [phone] Unbillable  Treatment Type: Individual Therapy  Risk Assessment: Danger to Self:  No Self-injurious Behavior: No Danger to Others: No  Subjective: This session was held via Caregility. The patient consented to audio teletherapy and was located in her car during this session. She is aware it is the responsibility of the patient to secure confidentiality on her end of the session. The provider was in a private home office for the duration of this session.    The patient arrived on time for her Caregility appointment.  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 Experiences role conflict between two or more roles (i.e., obligations or responsibilities for one role interfere with ability to perform duties for other roles). Describes stress and anxiety associated with cumulative demands of multiple roles. Experiences fatigue and difficulty concentrating (e.g., feeling overwhelmed) due to role overload. Experiences partner relationship strain and dissatisfaction due to inequitable division of responsibilities. Demonstrates disorganization, time management concerns, and an inability to maintain commitments due to role overload. Experiences parenting relationship strain (e.g., increased conflict, low parenting efficacy, child behavioral concerns). Experiences employment dissatisfaction due to limited or no recognition/credit for work efforts or product. Verbalizes  feelings of powerlessness or hopelessness due to inequitable salary differentials. Feels lost or disoriented due to lack of mentoring opportunities or access to role models, particularly in female-dominated occupations. Experiences distress and role overload due to a work environment that limits the ability to integrate family responsibilities with career advancement. Demonstrates dysphoric affect (e.g., sadness, hopelessness, helplessness, guilt, shame). Evidences impaired ability to concentrate or make decisions. Reports fatigue or lack of energy. Evidences diminished interest or pleasure in current and future activities. Reports irritability and/or crying spells. Verbalizes difficulty coping adequately with stress and conflict related to multiple roles (e.g., partner, caretaker, worker, consulting civil engineer). Describes poor interpersonal exchanges (e.g., isolation, loneliness, relationship distress). Experiences dissatisfaction, frustration, and hopelessness with regard to partner relationship. Describes poor communication with partner. Describes a lack of connection with partner, infrequent or no affection, and excessive involvement in activities outside of the relationship to avoid closeness to partner. Verbalizes frustration and conflict in the partner relationship due to an imbalance in roles and responsibilities (e.g., childcare, home maintenance, work). Goals: Improve depressed mood to maximize effective social, occupational, and physical functioning. Develop the necessary skills for effective, open communication and mutually satisfying intimacy. Develop healthy cognitive mechanisms to facilitate positive attitudes and beliefs about self within the context of one's environment to mitigate depressive symptoms. Identify and increase intrapersonal, interpersonal, and physical resources to foster positive coping strategies. Increase awareness of own role in relationship conflicts. Develop a sense of own  personal power and self-esteem within the relationship. Commit to improve relationship by working together as a team. Paediatric nurse that involve taking a proactive stance in the work environment. Learn and implement various coping skills for dealing with work- and career-related inequalities. Develop assertiveness skills and techniques for application in work-related activities (e.g., establishing boundaries with coworkers, garment/textile technologist). Explore career options that have been automatically ruled out due  to low self-concept or limited opportunities. Develop a support system to assist with multiple roles and responsibilities. Develop a realistic perspective regarding demands and obligations of multiple roles and their completion. Eliminate depressive and anxiety symptoms associated with trying to balance multiple roles. Develop time management strategies to reduce role overload and role conflicts. Objectives target date for all objectives is 01/11/2025: Describe role and responsibilities associated with work and family and related thoughts, feelings, and behaviors.   70% Describe at least three expectations related to work and family obligations and their relationship to gender role socialization and emotional conflict.   90% Clarify values and priorities.   80% Implement assertiveness skills and limit setting.   70% Learn and implement stress management and relaxation techniques to reduce fatigue, anxiety, and depressive symptoms.   60% Communicate needs with partner regarding multiple role obligations.   70% Learn and implement problem-solving and conflict-resolution skills.   50% Verbalize feelings of distress and dissatisfaction related to obstacles to career success. 50% Implement effective problem-solving skills to deal with a discriminatory work environment.   60% Identify and replace at least three negative self-talk statements that perpetuate feelings of  powerlessness or hopelessness.   60% Articulate signs and symptoms of depression in current life experiences.   70% Identify and replace cognitive self-talk that supports depression.   70% Implement relaxation and meditation techniques to manage and process career frustrations.   60% Understand and articulate how symptoms of depression compare to current clinical and research trends of depression for women.   70% Implement behavioral interventions to overcome depression.   50% Implement assertiveness to communicate needs and desires to others.   60% Implement conflict resolution skills to alleviate interpersonal sources of depressed mood.   50% Increase social contacts and communicate needs within existing interpersonal relationships.   60% Increase the level of physical exercise.   60% Express thoughts and feelings regarding the relationship in a direct manner.   60% Both partners identify and verbalize at least three expectations for the relationship. 50% Utilize at least two new conflict-resolution techniques to resolve issues reasonably. 60% Verbalize the feelings associated with grieving the loss of the relationship.   50% Increase time spent in enjoyable contact with the partner.   60% Share family and childhood experiences with each other to increase understanding and empathy.   60% Intervention Direct the client to resources and social opportunities within the community; assist the client in integrating these resources into a plan to start building new social relationships. Use behavioral techniques (e.g., education, modeling, role-playing, corrective feedback, positive reinforcement) to teach the couple problem-solving and conflict-resolution skills, including defining the problem constructively and specifically, brainstorming options, compromise, choosing options, implementing a plan, and evaluating the results. Teach conflict-resolution techniques such as Do's & Don't's List and Fair  Fighting Steps in The Intimate Enemy: How to Fight Fair in Love and Marriage  by Selmer and Conley; encourage the couple to practice these techniques in session and at home. Explore the family-of-origin history of each partner to discover patterns of destructive intimate relationship interactions that are being repeated in the present relationship; explore characteristics of a healthy relationship. Assist the client in identifying a clear verbal or behavioral signal to be used by either partner to terminate interaction immediately if either senses impending abuse. Explore with the client her multiple roles and responsibilities associated with work and family; clarify related thoughts and feelings. Explore with the client her expectations regarding the balance between work and family, and  discuss how this relates to internalized gender role stereotypes. Discuss the client's potential feelings of guilt, shame, and anxiety associated with not meeting internal or external expectations. Assist the client with identifying those expectations that are helpful and those that are potentially harmful; help the client discard those that are causing undue stress and strain and replace them with more realistic messages. Assign the client to read about progressive muscle relaxation and other calming strategies in relevant books or treatment manuals (e.g., Progressive Relaxation Training by Thornell and Elmer; Mastery of Your Anxiety and Optometrist by Venson Richarda Jonne armin Valorie). Assist the client in identifying conflicts that can be addressed using communication, conflict-resolution, and/or problem-solving skills (see Behavioral Marital Therapy by [facility] and Veatrice in the Handbook of Family Therapy, 2nd ed. by Nevelyn and Knickerson [Eds.]). Use behavioral techniques (i.e., education, modeling, role-playing, corrective feedback, positive reinforcement) to teach communication skills,  including assertive communication, offering positive feedback, active listening, making positive requests of others for behavior change, and giving negative feedback in an honest and respectful manner. Explore, along with the client, strategies for making her life more manageable and less stressful (e.g., waking up before the children to exercise or have a cup of tea, pack the children's lunches and backpacks the night before). Encourage the client to develop a schedule of roles and responsibilities with her partner; assign her to meet regularly with her partner to discuss, review, and revise the schedule as needed. Help the client to develop a realistic schedule that outlines the responsibilities of her partner and family members; help the client enlist the commitment of all individuals to the schedule. Refer and encourage the client to join relevant support groups (e.g., single mothers, overextended women, time management). Facilitate the client's development of a social support network to assist with multiple roles and responsibilities; develop a list of active support behaviors (e.g., babysitting, help with cleaning). Educate the client about, and encourage her to attend to, healthy eating, sleeping, and exercise. Explore the client's perception of her career success obstacles, including both overt and covert discriminatory practices related to gender, race/ethnicity, religion, and sexual orientation. Validate the client's experience and explore how the work environment impacts distress and dissatisfaction; assist her in listing the three most frustrating aspects of her employment. Teach the client effective problem-solving skills (e.g., clarify the problem, brainstorm solutions, consult with others for additional solutions, list the pros and cons of each solution, select and implement a plan of action, evaluate the consequences, redirect  efforts if necessary) to deal with those aspects of the work  environment that limit career development and participation. Reinforce the client's positive, realistic cognitive messages that enhance self-confidence and increase proactive career pursuits. Refer the client to shadowing or mentorship programs/workshops for aspiring career women. Refer the client to educational seminars, informational meetings, and other related activities to meet other career women and learn career success strategies. Encourage the client to share her feelings of depression to gain an insight into precipitating events and implications of symptoms; normalize her feelings of depression. Encourage the client to describe current and childhood experiences associated with key relationships (e.g., family-of-origin, peer and school relationships, dating relationships, female role models, extended family relationships), roles (e.g., partner, caretaker, worker, consulting civil engineer), and cultural experiences (e.g., gender roles) that may contribute to depression. Assign the client to participate as fully as possible in a healthy exercise regimen. Assist the client to consider upcoming stressors; brainstorm with her 10 ways to mitigate depression in the future. Encourage the client  to continue participation in positive social support systems. Offer the client booster sessions as necessary. Encourage the client to discuss cognitive distortions, including automatic thoughts (e.g., negative view of self, future, experience) and negative schemas (e.g., core beliefs about self and others based on earlier childhood experiences); assess frequency of negative self-statements associated with depression. Educate the client on available options to supplement counseling along with potential implications (e.g., couples and family therapy, support networks, financial support). Reinforce the client's positive, reality-based cognitive messages that enhance self-confidence and increase adaptive action (see Positive  Self-Talk in the Adult Psychotherapy Homework Planner, 2nd ed. by Jenniffer). Do behavioral experiments in which depressive automatic thoughts are treated as hypotheses/predictions, reality-based alternative hypotheses/ predictions are generated, and both are tested against the client's past, present, and/or future experiences.  Diagnosis:Adjustment disorder with mixed anxiety and depressed mood  Plan:  -meet again on Wednesday, May 16, 2024 at [facility].

## 2024-05-16 ENCOUNTER — Ambulatory Visit (INDEPENDENT_AMBULATORY_CARE_PROVIDER_SITE_OTHER): Admitting: Professional

## 2024-05-16 ENCOUNTER — Encounter: Payer: Self-pay | Admitting: Professional

## 2024-05-16 DIAGNOSIS — F4323 Adjustment disorder with mixed anxiety and depressed mood: Secondary | ICD-10-CM

## 2024-05-16 NOTE — Progress Notes (Signed)
 [facility] Counselor/Therapist Progress Note  Patient ID: Kelly Simmons, MRN: [id number],    Date: 05/16/2024  Time Spent: 50 minutes 103-153pm   Treatment Type: Individual Therapy  Risk Assessment: Danger to Self:  No Self-injurious Behavior: No Danger to Others: No  Subjective: This session was held via Careg[facility]. The patient consented to audio teletherapy and was located in her home during this session. She is aware it is the responsibility of the patient to secure confidentiality on her end of the session. The provider was in a private home office for the duration of this session.    The patient arrived on time for her Caregility appointment.  Issues addressed: 1-mood -pt feels like she is in a good place -app [employer] is a mood ap and she has started using 2-questions ADHD Completed ASRS with the patient -discussed options for pt to be evaluated 3-Eric -they are making an effort to be more present -they are talking more -she is making an effort to be more intimate -no additional counseling sessions scheduled and he has not responded to her request -lack of sexual intimacy causes her to question if he is cheating -a lot of their infidelity was related not them not being on the same page -she has to have good rest and is too tired -husband cheated 21 years ago -she doesn't think she focused on the infidelity until the children were around 12   Treatment Plan Problems: Balancing Work and [employer], Career Success Obstacles, Depression, Grief, Partner Relational Problems Symptoms: Experiences stress associated with balancing the multiple tasks associated with motherhood and work roles. Experiences role conflict between two or more roles (i.e., obligations or responsibilities for one role interfere with ability to perform duties for other roles). Describes stress and anxiety associated with cumulative demands of multiple roles. Experiences  fatigue and difficulty concentrating (e.g., feeling overwhelmed) due to role overload. Experiences partner relationship strain and dissatisfaction due to inequitable division of responsibilities. Demonstrates disorganization, time management concerns, and an inability to maintain commitments due to role overload. Experiences parenting relationship strain (e.g., increased conflict, low parenting efficacy, child behavioral concerns). Experiences employment dissatisfaction due to limited or no recognition/credit for work efforts or product. Verbalizes feelings of powerlessness or hopelessness due to inequitable salary differentials. Feels lost or disoriented due to lack of mentoring opportunities or access to role models, particularly in female-dominated occupations. Experiences distress and role overload due to a work environment that limits the ability to integrate family responsibilities with career advancement. Demonstrates dysphoric affect (e.g., sadness, hopelessness, helplessness, guilt, shame). Evidences impaired ability to concentrate or make decisions. Reports fatigue or lack of energy. Evidences diminished interest or pleasure in current and future activities. Reports irritability and/or crying spells. Verbalizes difficulty coping adequately with stress and conflict related to multiple roles (e.g., partner, caretaker, worker, consulting civil engineer). Describes poor interpersonal exchanges (e.g., isolation, loneliness, relationship distress). Experiences dissatisfaction, frustration, and hopelessness with regard to partner relationship. Describes poor communication with partner. Describes a lack of connection with partner, infrequent or no affection, and excessive involvement in activities outside of the relationship to avoid closeness to partner. Verbalizes frustration and conflict in the partner relationship due to an imbalance in roles and responsibilities (e.g., childcare, home maintenance,  work). Goals: Improve depressed mood to maximize effective social, occupational, and physical functioning. Develop the necessary skills for effective, open communication and mutually satisfying intimacy. Develop healthy cognitive mechanisms to facilitate positive attitudes and beliefs about self within the context of one's environment to mitigate  depressive symptoms. Identify and increase intrapersonal, interpersonal, and physical resources to foster positive coping strategies. Increase awareness of own role in relationship conflicts. Develop a sense of own personal power and self-esteem within the relationship. Commit to improve relationship by working together as a team. Paediatric nurse that involve taking a proactive stance in the work environment. Learn and implement various coping skills for dealing with work- and career-related inequalities. Develop assertiveness skills and techniques for application in work-related activities (e.g., establishing boundaries with coworkers, garment/textile technologist). Explore career options that have been automatically ruled out due to low self-concept or limited opportunities. Develop a support system to assist with multiple roles and responsibilities. Develop a realistic perspective regarding demands and obligations of multiple roles and their completion. Eliminate depressive and anxiety symptoms associated with trying to balance multiple roles. Develop time management strategies to reduce role overload and role conflicts. Objectives target date for all objectives is 01/11/2025: Describe role and responsibilities associated with work and family and related thoughts, feelings, and behaviors.   70% Describe at least three expectations related to work and family obligations and their relationship to gender role socialization and emotional conflict.   90% Clarify values and priorities.   80% Implement assertiveness skills and limit setting.    70% Learn and implement stress management and relaxation techniques to reduce fatigue, anxiety, and depressive symptoms.   60% Communicate needs with partner regarding multiple role obligations.   70% Learn and implement problem-solving and conflict-resolution skills.   50% Verbalize feelings of distress and dissatisfaction related to obstacles to career success. 50% Implement effective problem-solving skills to deal with a discriminatory work environment.   60% Identify and replace at least three negative self-talk statements that perpetuate feelings of powerlessness or hopelessness.   60% Articulate signs and symptoms of depression in current life experiences.   70% Identify and replace cognitive self-talk that supports depression.   70% Implement relaxation and meditation techniques to manage and process career frustrations.   60% Understand and articulate how symptoms of depression compare to current clinical and research trends of depression for women.   70% Implement behavioral interventions to overcome depression.   50% Implement assertiveness to communicate needs and desires to others.   60% Implement conflict resolution skills to alleviate interpersonal sources of depressed mood.   50% Increase social contacts and communicate needs within existing interpersonal relationships.   60% Increase the level of physical exercise.   60% Express thoughts and feelings regarding the relationship in a direct manner.   60% Both partners identify and verbalize at least three expectations for the relationship. 50% Utilize at least two new conflict-resolution techniques to resolve issues reasonably. 60% Verbalize the feelings associated with grieving the loss of the relationship.   50% Increase time spent in enjoyable contact with the partner.   60% Share family and childhood experiences with each other to increase understanding and empathy.   60% Intervention Direct the client to resources and social  opportunities within the community; assist the client in integrating these resources into a plan to start building new social relationships. Use behavioral techniques (e.g., education, modeling, role-playing, corrective feedback, positive reinforcement) to teach the couple problem-solving and conflict-resolution skills, including defining the problem constructively and specifically, brainstorming options, compromise, choosing options, implementing a plan, and evaluating the results. Teach conflict-resolution techniques such as Do's & Don't's List and Fair Fighting Steps in The Intimate Enemy: How to Fight Fair in Love and Marriage  by [place] and [place];  encourage the couple to practice these techniques in session and at home. Explore the family-of-origin history of each partner to discover patterns of destructive intimate relationship interactions that are being repeated in the present relationship; explore characteristics of a healthy relationship. Assist the client in identifying a clear verbal or behavioral signal to be used by either partner to terminate interaction immediately if either senses impending abuse. Explore with the client her multiple roles and responsibilities associated with work and family; clarify related thoughts and feelings. Explore with the client her expectations regarding the balance between work and family, and discuss how this relates to internalized gender role stereotypes. Discuss the client's potential feelings of guilt, shame, and anxiety associated with not meeting internal or external expectations. Assist the client with identifying those expectations that are helpful and those that are potentially harmful; help the client discard those that are causing undue stress and strain and replace them with more realistic messages. Assign the client to read about progressive muscle relaxation and other calming strategies in relevant books or treatment manuals (e.g., Progressive  Relaxation Training by [place] and [place]; Mastery of Your Anxiety and Optometrist by Venson Richarda Jonne armin Valorie). Assist the client in identifying conflicts that can be addressed using communication, conflict-resolution, and/or problem-solving skills (see Behavioral Marital Therapy by [facility] and [place] in the Handbook of Family Therapy, 2nd ed. by Nevelyn and Knickerson [Eds.]). Use behavioral techniques (i.e., education, modeling, role-playing, corrective feedback, positive reinforcement) to teach communication skills, including assertive communication, offering positive feedback, active listening, making positive requests of others for behavior change, and giving negative feedback in an honest and respectful manner. Explore, along with the client, strategies for making her life more manageable and less stressful (e.g., waking up before the children to exercise or have a cup of tea, pack the children's lunches and backpacks the night before). Encourage the client to develop a schedule of roles and responsibilities with her partner; assign her to meet regularly with her partner to discuss, review, and revise the schedule as needed. Help the client to develop a realistic schedule that outlines the responsibilities of her partner and family members; help the client enlist the commitment of all individuals to the schedule. Refer and encourage the client to join relevant support groups (e.g., single mothers, overextended women, time management). Facilitate the client's development of a social support network to assist with multiple roles and responsibilities; develop a list of active support behaviors (e.g., babysitting, help with cleaning). Educate the client about, and encourage her to attend to, healthy eating, sleeping, and exercise. Explore the client's perception of her career success obstacles, including both overt and covert discriminatory practices related to gender,  race/ethnicity, religion, and sexual orientation. Validate the client's experience and explore how the work environment impacts distress and dissatisfaction; assist her in listing the three most frustrating aspects of her employment. Teach the client effective problem-solving skills (e.g., clarify the problem, brainstorm solutions, consult with others for additional solutions, list the pros and cons of each solution, select and implement a plan of action, evaluate the consequences, redirect  efforts if necessary) to deal with those aspects of the work environment that limit career development and participation. Reinforce the client's positive, realistic cognitive messages that enhance self-confidence and increase proactive career pursuits. Refer the client to shadowing or mentorship programs/workshops for aspiring career women. Refer the client to educational seminars, informational meetings, and other related activities to meet other career women and learn career success strategies. Encourage the client to  share her feelings of depression to gain an insight into precipitating events and implications of symptoms; normalize her feelings of depression. Encourage the client to describe current and childhood experiences associated with key relationships (e.g., family-of-origin, peer and school relationships, dating relationships, female role models, extended family relationships), roles (e.g., partner, caretaker, worker, consulting civil engineer), and cultural experiences (e.g., gender roles) that may contribute to depression. Assign the client to participate as fully as possible in a healthy exercise regimen. Assist the client to consider upcoming stressors; brainstorm with her 10 ways to mitigate depression in the future. Encourage the client to continue participation in positive social support systems. Offer the client booster sessions as necessary. Encourage the client to discuss cognitive distortions, including  automatic thoughts (e.g., negative view of self, future, experience) and negative schemas (e.g., core beliefs about self and others based on earlier childhood experiences); assess frequency of negative self-statements associated with depression. Educate the client on available options to supplement counseling along with potential implications (e.g., couples and family therapy, support networks, financial support). Reinforce the client's positive, reality-based cognitive messages that enhance self-confidence and increase adaptive action (see Positive Self-Talk in the Adult Psychotherapy Homework Planner, 2nd ed. by Jenniffer). Do behavioral experiments in which depressive automatic thoughts are treated as hypotheses/predictions, reality-based alternative hypotheses/ predictions are generated, and both are tested against the client's past, present, and/or future experiences.  Diagnosis:Adjustment disorder with mixed anxiety and depressed mood  Plan:  -purge feelings related to infidelity -meet with spouse 15 minutes daily -meet again on Tuesday, May 29, 2024 at [employer].

## 2024-05-20 ENCOUNTER — Other Ambulatory Visit: Payer: Self-pay | Admitting: Medical-Surgical

## 2024-05-29 ENCOUNTER — Ambulatory Visit: Admitting: Professional

## 2024-05-29 ENCOUNTER — Encounter: Payer: Self-pay | Admitting: Professional

## 2024-05-29 ENCOUNTER — Other Ambulatory Visit: Payer: Self-pay | Admitting: Medical-Surgical

## 2024-05-29 DIAGNOSIS — F4323 Adjustment disorder with mixed anxiety and depressed mood: Secondary | ICD-10-CM

## 2024-05-29 NOTE — Progress Notes (Signed)
 [facility] Counselor/Therapist Progress Note  Patient ID: Kelly Simmons, MRN: [id number],    Date: 05/29/2024  Time Spent: 55 minutes [id number]   Treatment Type: Individual Therapy  Risk Assessment: Danger to Self:  No Self-injurious Behavior: No Danger to Others: No  Subjective: This session was held via Caregility. The patient consented to audio teletherapy and was located in her home during this session. She is aware it is the responsibility of the patient to secure confidentiality on her end of the session. The provider was in a private home office for the duration of this session.    The patient arrived on time for her Caregility appointment.  Issues addressed: 1-purge feelings related to infidelity -meet with spouse 15 minutes daily 2-Eric -showed up with his son Ole after telling her he was picking up his birthday present -he said it was a surprise and the pt found out that he knew Ole was coming as did her older son and her twins -he was not honest with her about not knowing and he did things all weekend but did not tell her -he did not speak with her most of Thanksgiving -he was gone an hour and a half but said he was going to get Ole to take him to the airport -he came back after an hour and a half he came back and said it stopped raining and Dorsey was going to take him 3-Eric's friend Salomon is an outwardly gay man, perhaps bisexual -she questions if he cheated on her with Salomon -Danny fondled the pt when they were at a party and she told her husband that she would never be around him -pt would release her spouse to be with whomever he wanted and not feel angry -pt feels that she is 50-75% out of the marriage -Danny's dad cheated on his mother their entire marriage -he is not close with his father or his brother  Treatment Plan Problems: Balancing Work and [employer], Career Success Obstacles, Depression, Grief, Partner  Relational Problems Symptoms: Experiences stress associated with balancing the multiple tasks associated with motherhood and work roles. Experiences role conflict between two or more roles (i.e., obligations or responsibilities for one role interfere with ability to perform duties for other roles). Describes stress and anxiety associated with cumulative demands of multiple roles. Experiences fatigue and difficulty concentrating (e.g., feeling overwhelmed) due to role overload. Experiences partner relationship strain and dissatisfaction due to inequitable division of responsibilities. Demonstrates disorganization, time management concerns, and an inability to maintain commitments due to role overload. Experiences parenting relationship strain (e.g., increased conflict, low parenting efficacy, child behavioral concerns). Experiences employment dissatisfaction due to limited or no recognition/credit for work efforts or product. Verbalizes feelings of powerlessness or hopelessness due to inequitable salary differentials. Feels lost or disoriented due to lack of mentoring opportunities or access to role models, particularly in female-dominated occupations. Experiences distress and role overload due to a work environment that limits the ability to integrate family responsibilities with career advancement. Demonstrates dysphoric affect (e.g., sadness, hopelessness, helplessness, guilt, shame). Evidences impaired ability to concentrate or make decisions. Reports fatigue or lack of energy. Evidences diminished interest or pleasure in current and future activities. Reports irritability and/or crying spells. Verbalizes difficulty coping adequately with stress and conflict related to multiple roles (e.g., partner, caretaker, worker, consulting civil engineer). Describes poor interpersonal exchanges (e.g., isolation, loneliness, relationship distress). Experiences dissatisfaction, frustration, and hopelessness with regard to  partner relationship. Describes poor communication with partner. Describes a lack of connection  with partner, infrequent or no affection, and excessive involvement in activities outside of the relationship to avoid closeness to partner. Verbalizes frustration and conflict in the partner relationship due to an imbalance in roles and responsibilities (e.g., childcare, home maintenance, work). Goals: Improve depressed mood to maximize effective social, occupational, and physical functioning. Develop the necessary skills for effective, open communication and mutually satisfying intimacy. Develop healthy cognitive mechanisms to facilitate positive attitudes and beliefs about self within the context of one's environment to mitigate depressive symptoms. Identify and increase intrapersonal, interpersonal, and physical resources to foster positive coping strategies. Increase awareness of own role in relationship conflicts. Develop a sense of own personal power and self-esteem within the relationship. Commit to improve relationship by working together as a team. Paediatric nurse that involve taking a proactive stance in the work environment. Learn and implement various coping skills for dealing with work- and career-related inequalities. Develop assertiveness skills and techniques for application in work-related activities (e.g., establishing boundaries with coworkers, garment/textile technologist). Explore career options that have been automatically ruled out due to low self-concept or limited opportunities. Develop a support system to assist with multiple roles and responsibilities. Develop a realistic perspective regarding demands and obligations of multiple roles and their completion. Eliminate depressive and anxiety symptoms associated with trying to balance multiple roles. Develop time management strategies to reduce role overload and role conflicts. Objectives target date for all  objectives is 01/11/2025: Describe role and responsibilities associated with work and family and related thoughts, feelings, and behaviors.   70% Describe at least three expectations related to work and family obligations and their relationship to gender role socialization and emotional conflict.   90% Clarify values and priorities.   80% Implement assertiveness skills and limit setting.   70% Learn and implement stress management and relaxation techniques to reduce fatigue, anxiety, and depressive symptoms.   60% Communicate needs with partner regarding multiple role obligations.   70% Learn and implement problem-solving and conflict-resolution skills.   50% Verbalize feelings of distress and dissatisfaction related to obstacles to career success. 50% Implement effective problem-solving skills to deal with a discriminatory work environment.   60% Identify and replace at least three negative self-talk statements that perpetuate feelings of powerlessness or hopelessness.   60% Articulate signs and symptoms of depression in current life experiences.   70% Identify and replace cognitive self-talk that supports depression.   70% Implement relaxation and meditation techniques to manage and process career frustrations.   60% Understand and articulate how symptoms of depression compare to current clinical and research trends of depression for women.   70% Implement behavioral interventions to overcome depression.   50% Implement assertiveness to communicate needs and desires to others.   60% Implement conflict resolution skills to alleviate interpersonal sources of depressed mood.   50% Increase social contacts and communicate needs within existing interpersonal relationships.   60% Increase the level of physical exercise.   60% Express thoughts and feelings regarding the relationship in a direct manner.   60% Both partners identify and verbalize at least three expectations for the relationship.  50% Utilize at least two new conflict-resolution techniques to resolve issues reasonably. 60% Verbalize the feelings associated with grieving the loss of the relationship.   50% Increase time spent in enjoyable contact with the partner.   60% Share family and childhood experiences with each other to increase understanding and empathy.   60% Intervention Direct the client to resources and social opportunities within the  community; assist the client in integrating these resources into a plan to start building new social relationships. Use behavioral techniques (e.g., education, modeling, role-playing, corrective feedback, positive reinforcement) to teach the couple problem-solving and conflict-resolution skills, including defining the problem constructively and specifically, brainstorming options, compromise, choosing options, implementing a plan, and evaluating the results. Teach conflict-resolution techniques such as Do's & Don't's List and Fair Fighting Steps in The Intimate Enemy: How to Fight Fair in Love and Marriage  by Selmer and Conley; encourage the couple to practice these techniques in session and at home. Explore the family-of-origin history of each partner to discover patterns of destructive intimate relationship interactions that are being repeated in the present relationship; explore characteristics of a healthy relationship. Assist the client in identifying a clear verbal or behavioral signal to be used by either partner to terminate interaction immediately if either senses impending abuse. Explore with the client her multiple roles and responsibilities associated with work and family; clarify related thoughts and feelings. Explore with the client her expectations regarding the balance between work and family, and discuss how this relates to internalized gender role stereotypes. Discuss the client's potential feelings of guilt, shame, and anxiety associated with not meeting internal or  external expectations. Assist the client with identifying those expectations that are helpful and those that are potentially harmful; help the client discard those that are causing undue stress and strain and replace them with more realistic messages. Assign the client to read about progressive muscle relaxation and other calming strategies in relevant books or treatment manuals (e.g., Progressive Relaxation Training by Thornell and Elmer; Mastery of Your Anxiety and Optometrist by Venson Richarda Jonne armin Valorie). Assist the client in identifying conflicts that can be addressed using communication, conflict-resolution, and/or problem-solving skills (see Behavioral Marital Therapy by [facility] Katy and Veatrice in the Handbook of Family Therapy, 2nd ed. by Nevelyn and Knickerson [Eds.]). Use behavioral techniques (i.e., education, modeling, role-playing, corrective feedback, positive reinforcement) to teach communication skills, including assertive communication, offering positive feedback, active listening, making positive requests of others for behavior change, and giving negative feedback in an honest and respectful manner. Explore, along with the client, strategies for making her life more manageable and less stressful (e.g., waking up before the children to exercise or have a cup of tea, pack the children's lunches and backpacks the night before). Encourage the client to develop a schedule of roles and responsibilities with her partner; assign her to meet regularly with her partner to discuss, review, and revise the schedule as needed. Help the client to develop a realistic schedule that outlines the responsibilities of her partner and family members; help the client enlist the commitment of all individuals to the schedule. Refer and encourage the client to join relevant support groups (e.g., single mothers, overextended women, time management). Facilitate the client's development of  a social support network to assist with multiple roles and responsibilities; develop a list of active support behaviors (e.g., babysitting, help with cleaning). Educate the client about, and encourage her to attend to, healthy eating, sleeping, and exercise. Explore the client's perception of her career success obstacles, including both overt and covert discriminatory practices related to gender, race/ethnicity, religion, and sexual orientation. Validate the client's experience and explore how the work environment impacts distress and dissatisfaction; assist her in listing the three most frustrating aspects of her employment. Teach the client effective problem-solving skills (e.g., clarify the problem, brainstorm solutions, consult with others for additional solutions, list the pros and cons of  each solution, select and implement a plan of action, evaluate the consequences, redirect  efforts if necessary) to deal with those aspects of the work environment that limit career development and participation. Reinforce the client's positive, realistic cognitive messages that enhance self-confidence and increase proactive career pursuits. Refer the client to shadowing or mentorship programs/workshops for aspiring career women. Refer the client to educational seminars, informational meetings, and other related activities to meet other career women and learn career success strategies. Encourage the client to share her feelings of depression to gain an insight into precipitating events and implications of symptoms; normalize her feelings of depression. Encourage the client to describe current and childhood experiences associated with key relationships (e.g., family-of-origin, peer and school relationships, dating relationships, female role models, extended family relationships), roles (e.g., partner, caretaker, worker, consulting civil engineer), and cultural experiences (e.g., gender roles) that may contribute to depression. Assign  the client to participate as fully as possible in a healthy exercise regimen. Assist the client to consider upcoming stressors; brainstorm with her 10 ways to mitigate depression in the future. Encourage the client to continue participation in positive social support systems. Offer the client booster sessions as necessary. Encourage the client to discuss cognitive distortions, including automatic thoughts (e.g., negative view of self, future, experience) and negative schemas (e.g., core beliefs about self and others based on earlier childhood experiences); assess frequency of negative self-statements associated with depression. Educate the client on available options to supplement counseling along with potential implications (e.g., couples and family therapy, support networks, financial support). Reinforce the client's positive, reality-based cognitive messages that enhance self-confidence and increase adaptive action (see Positive Self-Talk in the Adult Psychotherapy Homework Planner, 2nd ed. by Jenniffer). Do behavioral experiments in which depressive automatic thoughts are treated as hypotheses/predictions, reality-based alternative hypotheses/ predictions are generated, and both are tested against the client's past, present, and/or future experiences.  Diagnosis:Adjustment disorder with mixed anxiety and depressed mood  Plan:  -bring him to a session when desired -identify your feeling related to his lying behavior -meet again on Tuesday, June 12, 2024 at [employer].

## 2024-05-31 ENCOUNTER — Encounter: Payer: Self-pay | Admitting: Medical-Surgical

## 2024-05-31 ENCOUNTER — Ambulatory Visit: Admitting: Medical-Surgical

## 2024-05-31 VITALS — BP 138/86 | HR 76 | Resp 20 | Ht 61.0 in | Wt 171.0 lb

## 2024-05-31 DIAGNOSIS — F419 Anxiety disorder, unspecified: Secondary | ICD-10-CM

## 2024-05-31 DIAGNOSIS — R569 Unspecified convulsions: Secondary | ICD-10-CM

## 2024-05-31 DIAGNOSIS — R61 Generalized hyperhidrosis: Secondary | ICD-10-CM

## 2024-05-31 DIAGNOSIS — I10 Essential (primary) hypertension: Secondary | ICD-10-CM | POA: Diagnosis not present

## 2024-05-31 DIAGNOSIS — F32A Depression, unspecified: Secondary | ICD-10-CM

## 2024-05-31 DIAGNOSIS — Z23 Encounter for immunization: Secondary | ICD-10-CM | POA: Diagnosis not present

## 2024-05-31 MED ORDER — AMLODIPINE BESYLATE 2.5 MG PO TABS: 2.5000 mg | ORAL_TABLET | Freq: Every day | ORAL | 3 refills | Status: DC

## 2024-05-31 MED ORDER — VENLAFAXINE HCL ER 75 MG PO CP24: 225.0000 mg | ORAL_CAPSULE | Freq: Every day | ORAL | 3 refills | Status: DC

## 2024-05-31 MED ORDER — AMLODIPINE BESYLATE 5 MG PO TABS: 5.0000 mg | ORAL_TABLET | Freq: Every day | ORAL | 3 refills | Status: DC

## 2024-05-31 MED ORDER — ALBUTEROL SULFATE HFA 108 (90 BASE) MCG/ACT IN AERS: 1.0000 | INHALATION_SPRAY | RESPIRATORY_TRACT | 5 refills | Status: AC | PRN

## 2024-05-31 MED ORDER — HYDROCHLOROTHIAZIDE 25 MG PO TABS: 25.0000 mg | ORAL_TABLET | Freq: Every day | ORAL | 3 refills | Status: AC

## 2024-05-31 NOTE — Progress Notes (Addendum)
 Established Patient Office Visit  Subjective   Patient ID: Kelly Simmons, female    DOB: [birth year]  Age: 48 y.o. MRN: [id number]  Chief Complaint  Patient presents with   Hypertension    Check   Medication Refill    Hypertension  Medication Refill   48 year old female presents for a blood pressure follow up and medication follow up.  Essential Hypertension Patient is currently taking amlodipine  5 mg daily and hydrochlorothiazide  25 mg daily. She does not routinely check her blood pressures at home, but will periodically have it checked at work and is under 130/90 usually. Denies chest pain, shortness of breath, heart palpitations, or dizziness.  Anxiety and Depression Patient is currently taking Effexor  XR 300 mg in the mornings. She is prescribed to take three 75 mg tablets daily. She states that she ran out 75 mg tablets and started doubling up on the 150 mg tablets. Reports feeling well on current dose.  Seizure  Patient is currently taking Trileptal 150 mg daily. Denies any seizures since March 2025. Follow up with Neuro in January.   Review of Systems  Constitutional: Negative.   HENT: Negative.    Eyes: Negative.   Respiratory: Negative.    Cardiovascular: Negative.   Gastrointestinal: Negative.   Genitourinary: Negative.   Musculoskeletal: Negative.   Skin: Negative.   Neurological: Negative.   Endo/Heme/Allergies: Negative.   Psychiatric/Behavioral: Negative.        Objective:     BP 138/86 (BP Location: Left Arm, Cuff Size: Normal)   Pulse 76   Resp 20   Ht 5' 1 (1.549 m)   Wt 77.6 kg   SpO2 100%   BMI 32.31 kg/m  BP Readings from Last 3 Encounters:  05/31/24 138/86  12/24/23 136/83  12/09/23 (!) 141/80      Physical Exam Vitals and nursing note reviewed.  Constitutional:      General: She is not in acute distress.    Appearance: Normal appearance.  Cardiovascular:     Rate and Rhythm: Normal rate and regular rhythm.     Pulses:  Normal pulses.     Heart sounds: Normal heart sounds.  Pulmonary:     Effort: Pulmonary effort is normal.     Breath sounds: Normal breath sounds.  Neurological:     General: No focal deficit present.     Mental Status: She is alert and oriented to person, place, and time.  Psychiatric:        Mood and Affect: Mood normal.        Behavior: Behavior normal.        Thought Content: Thought content normal.        Judgment: Judgment normal.      No results found for any visits on 05/31/24.  Last CBC Lab Results  Component Value Date   WBC 4.1 04/04/2023   HGB 12.2 04/04/2023   HCT 39.8 04/04/2023   MCV 77.2 (L) 04/04/2023   MCH 24.2 (L) 03/17/2022   RDW 14.1 04/04/2023   PLT 232.0 04/04/2023   Last metabolic panel Lab Results  Component Value Date   GLUCOSE 90 04/04/2023   NA 139 04/04/2023   K 4.2 04/04/2023   CL 106 04/04/2023   CO2 28 04/04/2023   BUN 11 04/04/2023   CREATININE 0.83 04/04/2023   GFR 82.74 04/04/2023   CALCIUM 9.2 04/04/2023   PROT 6.6 04/04/2023   ALBUMIN 4.1 04/04/2023   BILITOT 0.4 04/04/2023   ALKPHOS 44  04/04/2023   AST 16 04/04/2023   ALT 9 04/04/2023   Last lipids Lab Results  Component Value Date   CHOL 235 (H) 04/13/2023   HDL 103 04/13/2023   LDLCALC 121 (H) 04/13/2023   TRIG 66 04/13/2023   CHOLHDL 2.3 04/13/2023      The ASCVD Risk score (Arnett DK, et al., 2019) failed to calculate for the following reasons:   The valid HDL cholesterol range is 20 to 100 mg/dL    Assessment & Plan:   1. Anxiety and depression -Restart Effexor  75 mg three tablets in the morning for a 225 mg dose -Refill sent to the pharmacy  2. Essential hypertension - Increase in Amlodipine  dosage to 7.5 mg daily -Continue taking hydrochlorothiazide  25 mg daily -Follow up in a couple of weeks for BP check  3. Immunization due (Primary) -Patient given flu, pneumonia, and shingles vaccine given today  4. Seizure (HCC) -Continue with current  regimen Trileptal 150 mg daily -Follow up with Neuro in January 2026  5. Night sweats -Continue with current regimen  Return in about 2 weeks (around 06/14/2024) for Nurse Visit BP Check.    Derrek JINNY Freund, NP Student

## 2024-05-31 NOTE — Progress Notes (Signed)
 Medical screening examination/treatment was performed by qualified clinical staff member and as supervising provider I was immediately available for consultation/collaboration. I have reviewed documentation and agree with assessment and plan.  Thayer Ohm, DNP, APRN, FNP-BC [facility]

## 2024-06-06 ENCOUNTER — Encounter: Admitting: Medical-Surgical

## 2024-06-12 ENCOUNTER — Ambulatory Visit: Admitting: Professional

## 2024-06-14 ENCOUNTER — Telehealth: Payer: Self-pay

## 2024-06-14 NOTE — Telephone Encounter (Signed)
 Copied from CRM [id number]. Topic: Clinical - Medical Advice >> Jun 14, 2024  8:44 AM Avram MATSU wrote: Reason for CRM: patient has a bump about inch long, left arm, kind of bruised, itchy, no pain. She had got a vaccine last week and the bump is still on injection shot. Patient would like to know what to do. [phone]

## 2024-06-24 ENCOUNTER — Other Ambulatory Visit: Payer: Self-pay | Admitting: Medical-Surgical

## 2024-06-26 ENCOUNTER — Encounter: Admitting: Medical-Surgical

## 2024-06-26 VITALS — BP 140/84 | HR 73 | Resp 20 | Ht 61.0 in | Wt 175.0 lb

## 2024-06-26 DIAGNOSIS — F32A Depression, unspecified: Secondary | ICD-10-CM

## 2024-06-26 DIAGNOSIS — I10 Essential (primary) hypertension: Secondary | ICD-10-CM

## 2024-06-26 DIAGNOSIS — M797 Fibromyalgia: Secondary | ICD-10-CM

## 2024-06-26 DIAGNOSIS — R61 Generalized hyperhidrosis: Secondary | ICD-10-CM | POA: Diagnosis not present

## 2024-06-26 DIAGNOSIS — Z0000 Encounter for general adult medical examination without abnormal findings: Secondary | ICD-10-CM | POA: Diagnosis not present

## 2024-06-26 DIAGNOSIS — F419 Anxiety disorder, unspecified: Secondary | ICD-10-CM | POA: Diagnosis not present

## 2024-06-26 DIAGNOSIS — E7800 Pure hypercholesterolemia, unspecified: Secondary | ICD-10-CM

## 2024-06-26 DIAGNOSIS — R569 Unspecified convulsions: Secondary | ICD-10-CM | POA: Insufficient documentation

## 2024-06-26 MED ORDER — VENLAFAXINE HCL ER 75 MG PO CP24: 225.0000 mg | ORAL_CAPSULE | Freq: Every day | ORAL | 3 refills | Status: AC

## 2024-06-26 MED ORDER — MELOXICAM 15 MG PO TABS: 15.0000 mg | ORAL_TABLET | Freq: Every day | ORAL | 3 refills | Status: AC

## 2024-06-26 MED ORDER — AMLODIPINE BESYLATE 5 MG PO TABS: 5.0000 mg | ORAL_TABLET | Freq: Every day | ORAL | 3 refills | Status: AC

## 2024-06-26 MED ORDER — AMLODIPINE BESYLATE 2.5 MG PO TABS: 2.5000 mg | ORAL_TABLET | Freq: Every day | ORAL | 3 refills | Status: AC

## 2024-06-26 NOTE — Patient Instructions (Signed)
 Preventive Care 50-50 Years Old, Female  Preventive care refers to lifestyle choices and visits with your health care provider that can promote health and wellness. Preventive care visits are also called wellness exams.  What can I expect for my preventive care visit?  Counseling  Your health care provider may ask you questions about your:  Medical history, including:  Past medical problems.  Family medical history.  Pregnancy history.  Current health, including:  Menstrual cycle.  Method of birth control.  Emotional well-being.  Home life and relationship well-being.  Sexual activity and sexual health.  Lifestyle, including:  Alcohol, nicotine or tobacco, and drug use.  Access to firearms.  Diet, exercise, and sleep habits.  Work and work Astronomer.  Sunscreen use.  Safety issues such as seatbelt and bike helmet use.  Physical exam  Your health care provider will check your:  Height and weight. These may be used to calculate your BMI (body mass index). BMI is a measurement that tells if you are at a healthy weight.  Waist circumference. This measures the distance around your waistline. This measurement also tells if you are at a healthy weight and may help predict your risk of certain diseases, such as type 2 diabetes and high blood pressure.  Heart rate and blood pressure.  Body temperature.  Skin for abnormal spots.  What immunizations do I need?    Vaccines are usually given at various ages, according to a schedule. Your health care provider will recommend vaccines for you based on your age, medical history, and lifestyle or other factors, such as travel or where you work.  What tests do I need?  Screening  Your health care provider may recommend screening tests for certain conditions. This may include:  Lipid and cholesterol levels.  Diabetes screening. This is done by checking your blood sugar (glucose) after you have not eaten for a while (fasting).  Pelvic exam and Pap test.  Hepatitis B test.  Hepatitis C  test.  HIV (human immunodeficiency virus) test.  STI (sexually transmitted infection) testing, if you are at risk.  Lung cancer screening.  Colorectal cancer screening.  Mammogram. Talk with your health care provider about when you should start having regular mammograms. This may depend on whether you have a family history of breast cancer.  BRCA-related cancer screening. This may be done if you have a family history of breast, ovarian, tubal, or peritoneal cancers.  Bone density scan. This is done to screen for osteoporosis.  Talk with your health care provider about your test results, treatment options, and if necessary, the need for more tests.  Follow these instructions at home:  Eating and drinking    Eat a diet that includes fresh fruits and vegetables, whole grains, lean protein, and low-fat dairy products.  Take vitamin and mineral supplements as recommended by your health care provider.  Do not drink alcohol if:  Your health care provider tells you not to drink.  You are pregnant, may be pregnant, or are planning to become pregnant.  If you drink alcohol:  Limit how much you have to 0-1 drink a day.  Know how much alcohol is in your drink. In the [place] one drink equals one 12 oz bottle of beer (355 mL), one 5 oz glass of wine (148 mL), or one 1 oz glass of hard liquor (44 mL).  Lifestyle  Brush your teeth every morning and night with fluoride toothpaste. Floss one time each day.  Exercise for at least  30 minutes 5 or more days each week.  Do not use any products that contain nicotine or tobacco. These products include cigarettes, chewing tobacco, and vaping devices, such as e-cigarettes. If you need help quitting, ask your health care provider.  Do not use drugs.  If you are sexually active, practice safe sex. Use a condom or other form of protection to prevent STIs.  If you do not wish to become pregnant, use a form of birth control. If you plan to become pregnant, see your health care provider for a  prepregnancy visit.  Take aspirin only as told by your health care provider. Make sure that you understand how much to take and what form to take. Work with your health care provider to find out whether it is safe and beneficial for you to take aspirin daily.  Find healthy ways to manage stress, such as:  Meditation, yoga, or listening to music.  Journaling.  Talking to a trusted person.  Spending time with friends and family.  Minimize exposure to UV radiation to reduce your risk of skin cancer.  Safety  Always wear your seat belt while driving or riding in a vehicle.  Do not drive:  If you have been drinking alcohol. Do not ride with someone who has been drinking.  When you are tired or distracted.  While texting.  If you have been using any mind-altering substances or drugs.  Wear a helmet and other protective equipment during sports activities.  If you have firearms in your house, make sure you follow all gun safety procedures.  Seek help if you have been physically or sexually abused.  What's next?  Visit your health care provider once a year for an annual wellness visit.  Ask your health care provider how often you should have your eyes and teeth checked.  Stay up to date on all vaccines.  This information is not intended to replace advice given to you by your health care provider. Make sure you discuss any questions you have with your health care provider.  Document Revised: 12/10/2020 Document Reviewed: 12/10/2020  Elsevier Patient Education  2024 [employer].

## 2024-06-26 NOTE — Progress Notes (Signed)
 "  Complete physical exam  Patient: Kelly Simmons   DOB: [birth year]   48 y.o. Female  MRN: [id number]  Subjective:    Chief Complaint  Patient presents with   Annual Exam   Hypertension    Kelly Simmons is a 48 y.o. female who presents today for a complete physical exam. She reports consuming a general diet. The patient does not participate in regular exercise at present. She generally feels well. She reports sleeping well. She does not have additional problems to discuss today.    Most recent fall risk assessment:    06/26/2024    2:06 PM  Fall Risk   Falls in the past year? 1  Number falls in past yr: 1  Injury with Fall? 1  Risk for fall due to : History of fall(s)  Follow up Falls evaluation completed     Most recent depression screenings:    06/26/2024    2:06 PM 04/03/2024    3:30 PM  PHQ 2/9 Scores  PHQ - 2 Score 2   PHQ- 9 Score 6      Information is confidential and restricted. Go to Review Flowsheets to unlock data.    Vision:Within last year and Dental: No current dental problems and Receives regular dental care    Patient Care Team: Willo Mini, NP as PCP - General (Nurse Practitioner)   Show/hide medication list[1]  Review of Systems  Constitutional:  Negative for chills, fever, malaise/fatigue and weight loss.  HENT:  Negative for congestion, ear pain, hearing loss, sinus pain and sore throat.   Eyes:  Negative for blurred vision, photophobia and pain.  Respiratory:  Negative for cough, shortness of breath and wheezing.   Cardiovascular:  Negative for chest pain, palpitations and leg swelling.  Gastrointestinal:  Negative for abdominal pain, constipation, diarrhea, heartburn, nausea and vomiting.  Genitourinary:  Negative for dysuria, frequency and urgency.  Musculoskeletal:  Positive for joint pain and myalgias. Negative for falls and neck pain.  Skin:  Negative for itching and rash.  Neurological:  Negative for dizziness, weakness and  headaches.  Endo/Heme/Allergies:  Negative for polydipsia. Does not bruise/bleed easily.  Psychiatric/Behavioral:  Negative for depression, substance abuse and suicidal ideas. The patient is not nervous/anxious and does not have insomnia.      Objective:     BP (!) 140/84   Pulse 73   Resp 20   Ht 5' 1 (1.549 m)   Wt 175 lb (79.4 kg)   SpO2 100%   BMI 33.07 kg/m    Physical Exam Vitals reviewed.  Constitutional:      General: She is not in acute distress.    Appearance: Normal appearance. She is obese. She is not ill-appearing.  HENT:     Head: Normocephalic and atraumatic.     Right Ear: Tympanic membrane, ear canal and external ear normal. There is no impacted cerumen.     Left Ear: Tympanic membrane, ear canal and external ear normal. There is no impacted cerumen.     Nose: Nose normal. No congestion or rhinorrhea.     Mouth/Throat:     Mouth: Mucous membranes are moist.     Pharynx: No oropharyngeal exudate or posterior oropharyngeal erythema.  Eyes:     General: No scleral icterus.       Right eye: No discharge.        Left eye: No discharge.     Extraocular Movements: Extraocular movements intact.     Conjunctiva/sclera: Conjunctivae  normal.     Pupils: Pupils are equal, round, and reactive to light.  Neck:     Thyroid: No thyromegaly.     Vascular: No carotid bruit or JVD.     Trachea: Trachea normal.  Cardiovascular:     Rate and Rhythm: Normal rate and regular rhythm.     Pulses: Normal pulses.     Heart sounds: Normal heart sounds. No murmur heard.    No friction rub. No gallop.  Pulmonary:     Effort: Pulmonary effort is normal. No respiratory distress.     Breath sounds: Normal breath sounds. No wheezing.  Abdominal:     General: Bowel sounds are normal. There is no distension.     Palpations: Abdomen is soft.     Tenderness: There is no abdominal tenderness. There is no guarding.  Musculoskeletal:        General: Normal range of motion.      Cervical back: Normal range of motion and neck supple.  Lymphadenopathy:     Cervical: No cervical adenopathy.  Skin:    General: Skin is warm and dry.  Neurological:     Mental Status: She is alert and oriented to person, place, and time.     Cranial Nerves: No cranial nerve deficit.  Psychiatric:        Mood and Affect: Mood normal.        Behavior: Behavior normal.        Thought Content: Thought content normal.        Judgment: Judgment normal.      No results found for any visits on 06/26/24.     Assessment & Plan:    Routine Health Maintenance and Physical Exam  Immunization History  Administered Date(s) Administered   Influenza, Seasonal, Injecte, Preservative Fre 03/17/2023, 05/31/2024   Influenza,inj,Quad PF,6+ Mos 05/12/2016, 04/21/2018, 03/28/2019, 05/27/2021, 03/17/2022   Influenza-Unspecified 04/10/2012, 04/04/2013, 04/29/2014, 05/11/2015, 04/17/2017, 04/21/2018   PFIZER(Purple Top)SARS-COV-2 Vaccination 09/12/2019, 10/03/2019, 05/06/2020   PNEUMOCOCCAL CONJUGATE-20 05/31/2024   Pfizer Covid-19 Vaccine Bivalent Booster 9yrs & up 06/18/2021   Tdap 12/01/2008, 12/11/2008, 07/12/2018   Zoster Recombinant(Shingrix ) 05/31/2024    Health Maintenance  Topic Date Due   Hepatitis B Vaccines 19-59 Average Risk (1 of 3 - 19+ 3-dose series) Never done   COVID-19 Vaccine (5 - 2025-26 season) 07/12/2024 (Originally 02/27/2024)   Zoster Vaccines- Shingrix  (2 of 2) 07/26/2024   Mammogram  12/21/2025   DTaP/Tdap/Td (4 - Td or Tdap) 07/12/2028   Colonoscopy  05/29/2033   Pneumococcal Vaccine: 50+ Years  Completed   Influenza Vaccine  Completed   Hepatitis C Screening  Completed   HIV Screening  Completed   HPV VACCINES  Aged Out   Meningococcal B Vaccine  Aged Out    Discussed health benefits of physical activity, and encouraged her to engage in regular exercise appropriate for her age and condition.  1. Annual physical exam (Primary) Checking labs as below. UTD on  preventative care. Wellness information provided with AVS. - CBC with Differential/Platelet - Comprehensive metabolic panel with GFR - Lipid panel  2. Night sweats Continue Effexor  225mg  daily.  - venlafaxine  XR (EFFEXOR -XR) 75 MG 24 hr capsule; Take 3 capsules (225 mg total) by mouth daily with breakfast.  Dispense: 270 capsule; Refill: 3  3. Seizure (HCC) Recently experienced another seizure and her Trileptal dose was increased a couple of days ago to 300mg  BID. Has questions regarding the workup and possible cause of seizures at this age. Would like  to seek a second opinion. New referral placed to Neurology per patient request.  - Ambulatory referral to Neurology  4. Essential hypertension BP elevated on arrival, still elevated but better on recheck. Updating labs. Continue Amlodipine  7.5mg  daily. Monitor BP at home with a goal of less than 130/80. If consistently higher, return for medication adjustment.  - amLODipine  (NORVASC ) 2.5 MG tablet; Take 1 tablet (2.5 mg total) by mouth daily. Take with 5mg  dose to equal 7.5mg  daily.  Dispense: 90 tablet; Refill: 3 - amLODipine  (NORVASC ) 5 MG tablet; Take 1 tablet (5 mg total) by mouth daily.  Dispense: 90 tablet; Refill: 3 - CBC with Differential/Platelet - Comprehensive metabolic panel with GFR - Lipid panel  5. Fibromyalgia Continue meloxicam  15mg  daily as needed.  - meloxicam  (MOBIC ) 15 MG tablet; Take 1 tablet (15 mg total) by mouth daily.  Dispense: 90 tablet; Refill: 3  6. Anxiety and depression Well managed overall with Effexor  225mg  daily. No dose or medication changes needed.   Return in about 6 months (around 12/25/2024) for chronic disease follow up.   Ryszard Socarras, NP      [1]  Outpatient Medications Prior to Visit  Medication Sig   albuterol  (VENTOLIN  HFA) 108 (90 Base) MCG/ACT inhaler Inhale 1-2 puffs into the lungs every 4 (four) hours as needed for wheezing or shortness of breath.   hydrochlorothiazide  (HYDRODIURIL )  25 MG tablet Take 1 tablet (25 mg total) by mouth daily.   Oxcarbazepine (TRILEPTAL) 300 MG tablet Take 600 mg by mouth 2 (two) times daily.   valACYclovir  (VALTREX ) 1000 MG tablet Take 2 tablets by mouth twice daily for one day as need for cold sore   [DISCONTINUED] amLODipine  (NORVASC ) 2.5 MG tablet Take 1 tablet (2.5 mg total) by mouth daily. Take with 5mg  dose to equal 7.5mg  daily.   [DISCONTINUED] amLODipine  (NORVASC ) 5 MG tablet Take 1 tablet (5 mg total) by mouth daily.   [DISCONTINUED] meloxicam  (MOBIC ) 15 MG tablet TAKE 1 TABLET (15 MG TOTAL) BY MOUTH DAILY.   [DISCONTINUED] venlafaxine  XR (EFFEXOR -XR) 75 MG 24 hr capsule Take 3 capsules (225 mg total) by mouth daily with breakfast.   [DISCONTINUED] Cholecalciferol 100 MCG (4000 UT) CAPS Take by mouth.   [DISCONTINUED] OXcarbazepine (TRILEPTAL) 150 MG tablet 150 mg.   No facility-administered medications prior to visit.   "

## 2024-06-27 ENCOUNTER — Ambulatory Visit: Payer: Self-pay | Admitting: Medical-Surgical

## 2024-06-27 DIAGNOSIS — E785 Hyperlipidemia, unspecified: Secondary | ICD-10-CM

## 2024-06-27 DIAGNOSIS — E7800 Pure hypercholesterolemia, unspecified: Secondary | ICD-10-CM

## 2024-06-27 LAB — COMPREHENSIVE METABOLIC PANEL WITH GFR
ALT: 20 IU/L (ref 0–32)
AST: 24 IU/L (ref 0–40)
Albumin: 4.6 g/dL (ref 3.9–4.9)
Alkaline Phosphatase: 79 IU/L (ref 41–116)
BUN/Creatinine Ratio: 10 (ref 9–23)
BUN: 8 mg/dL (ref 6–24)
Bilirubin Total: 0.3 mg/dL (ref 0.0–1.2)
CO2: 26 mmol/L (ref 20–29)
Calcium: 9.6 mg/dL (ref 8.7–10.2)
Chloride: 93 mmol/L — ABNORMAL LOW (ref 96–106)
Creatinine, Ser: 0.82 mg/dL (ref 0.57–1.00)
Globulin, Total: 2.5 g/dL (ref 1.5–4.5)
Glucose: 84 mg/dL (ref 70–99)
Potassium: 3.4 mmol/L — ABNORMAL LOW (ref 3.5–5.2)
Sodium: 134 mmol/L (ref 134–144)
Total Protein: 7.1 g/dL (ref 6.0–8.5)
eGFR: 87 mL/min/1.73

## 2024-06-27 LAB — LIPID PANEL
Chol/HDL Ratio: 2.6 ratio (ref 0.0–4.4)
Cholesterol, Total: 315 mg/dL — ABNORMAL HIGH (ref 100–199)
HDL: 121 mg/dL
LDL Chol Calc (NIH): 180 mg/dL — ABNORMAL HIGH (ref 0–99)
Triglycerides: 91 mg/dL (ref 0–149)
VLDL Cholesterol Cal: 14 mg/dL (ref 5–40)

## 2024-06-27 LAB — CBC WITH DIFFERENTIAL/PLATELET
Basophils Absolute: 0 x10E3/uL (ref 0.0–0.2)
Basos: 1 %
EOS (ABSOLUTE): 0.1 x10E3/uL (ref 0.0–0.4)
Eos: 3 %
Hematocrit: 37.6 % (ref 34.0–46.6)
Hemoglobin: 12.5 g/dL (ref 11.1–15.9)
Immature Grans (Abs): 0 x10E3/uL (ref 0.0–0.1)
Immature Granulocytes: 0 %
Lymphocytes Absolute: 2.4 x10E3/uL (ref 0.7–3.1)
Lymphs: 55 %
MCH: 24.9 pg — ABNORMAL LOW (ref 26.6–33.0)
MCHC: 33.2 g/dL (ref 31.5–35.7)
MCV: 75 fL — ABNORMAL LOW (ref 79–97)
Monocytes Absolute: 0.5 x10E3/uL (ref 0.1–0.9)
Monocytes: 11 %
Neutrophils Absolute: 1.3 x10E3/uL — ABNORMAL LOW (ref 1.4–7.0)
Neutrophils: 30 %
Platelets: 344 x10E3/uL (ref 150–450)
RBC: 5.02 x10E6/uL (ref 3.77–5.28)
RDW: 13.9 % (ref 11.7–15.4)
WBC: 4.2 x10E3/uL (ref 3.4–10.8)

## 2024-06-27 NOTE — Addendum Note (Signed)
 Addended by: FANNY NIELS CROME on: 06/27/2024 04:08 PM   Modules accepted: Orders

## 2024-06-29 LAB — SPECIMEN STATUS REPORT

## 2024-06-29 LAB — LIPOPROTEIN A (LPA): Lipoprotein (a): 351.9 nmol/L — ABNORMAL HIGH

## 2024-06-29 MED ORDER — ATORVASTATIN CALCIUM 20 MG PO TABS: 20.0000 mg | ORAL_TABLET | Freq: Every day | ORAL | 3 refills | Status: AC

## 2024-07-10 ENCOUNTER — Encounter: Payer: Self-pay | Admitting: Professional

## 2024-07-10 ENCOUNTER — Ambulatory Visit: Admitting: Professional

## 2024-07-10 DIAGNOSIS — F4323 Adjustment disorder with mixed anxiety and depressed mood: Secondary | ICD-10-CM

## 2024-07-10 DIAGNOSIS — F419 Anxiety disorder, unspecified: Secondary | ICD-10-CM | POA: Diagnosis not present

## 2024-07-10 DIAGNOSIS — F32A Depression, unspecified: Secondary | ICD-10-CM | POA: Diagnosis not present

## 2024-07-10 NOTE — Progress Notes (Signed)
 "  [facility] Counselor/Therapist Progress Note  Patient ID: Kelly Simmons, MRN: [id number],    Date: 07/10/2024  Time Spent: 38 minutes [id number]   Treatment Type: Individual Therapy  Risk Assessment: Danger to Self:  No Self-injurious Behavior: No Danger to Others: No  Subjective: This session was held via Caregility. The patient consented to audio teletherapy and was located in her home during this session. She is aware it is the responsibility of the patient to secure confidentiality on her end of the session. The provider was in a private home office for the duration of this session.    The patient arrived on time for her Caregility appointment.  Issues addressed: 1-homework thought about it -bring him to a session when desired -identify your feeling related to his lying behavior- -pt stopped smoking marijuana and was weaning herself off when she had the first seizure -she noticed she was feeling more cloudy and unable to think about the works she wanted to say 2-physical -visited MD before Christmas -her BP and cholesterol (both run in family) -had seizure episode before Christmas -she went to neurologist in early January but has been having more seizures and she still can't drive and has had to have an increase in meds -seizures are in frontal lobe and she will needs meds the rest of life unless they figure out how to stop them -neurologist unsure if new medication will be beneficial -side effects are really significant -pt is aware of alzheimer's/dementia in her family -when she learned she cannot drive she cried for several days 3-Eric -thinks she doesn't want to ride with him when she talks that she doesn't  4-professional -is now working with same title but some responsibilities 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 Experiences role conflict between two or more roles (i.e., obligations or responsibilities for one role interfere with ability to perform duties for other roles). Describes stress and anxiety associated with cumulative demands of multiple roles. Experiences fatigue and difficulty concentrating (e.g., feeling overwhelmed) due to role overload. Experiences partner relationship strain and dissatisfaction due to inequitable division of responsibilities. Demonstrates disorganization, time management concerns, and an inability to maintain commitments due to role overload. Experiences parenting relationship strain (e.g., increased conflict, low parenting efficacy, child behavioral concerns). Experiences employment dissatisfaction due to limited or no recognition/credit for work efforts or product. Verbalizes feelings of powerlessness or hopelessness due to inequitable salary differentials. Feels lost or disoriented due to lack of mentoring opportunities or access to role models, particularly in female-dominated occupations. Experiences distress and role overload due to a work environment that limits the ability to integrate family responsibilities with career advancement. Demonstrates dysphoric affect (e.g., sadness, hopelessness, helplessness, guilt, shame). Evidences impaired ability to concentrate or make decisions. Reports fatigue or lack of energy. Evidences diminished interest or pleasure in current and future activities. Reports irritability and/or crying spells. Verbalizes difficulty coping adequately with stress and conflict related to multiple roles (e.g., partner, caretaker, worker, consulting civil engineer). Describes poor interpersonal exchanges (e.g., isolation, loneliness, relationship distress). Experiences dissatisfaction,  frustration, and hopelessness with regard to partner relationship. Describes poor communication with partner. Describes a lack of connection with partner, infrequent or no affection, and excessive involvement in activities outside of the relationship to avoid closeness to partner.  Verbalizes frustration and conflict in the partner relationship due to an imbalance in roles and responsibilities (e.g., childcare, home maintenance, work). Goals: Improve depressed mood to maximize effective social, occupational, and physical functioning. Develop the necessary skills for effective, open communication and mutually satisfying intimacy. Develop healthy cognitive mechanisms to facilitate positive attitudes and beliefs about self within the context of one's environment to mitigate depressive symptoms. Identify and increase intrapersonal, interpersonal, and physical resources to foster positive coping strategies. Increase awareness of own role in relationship conflicts. Develop a sense of own personal power and self-esteem within the relationship. Commit to improve relationship by working together as a team. Paediatric nurse that involve taking a proactive stance in the work environment. Learn and implement various coping skills for dealing with work- and career-related inequalities. Develop assertiveness skills and techniques for application in work-related activities (e.g., establishing boundaries with coworkers, garment/textile technologist). Explore career options that have been automatically ruled out due to low self-concept or limited opportunities. Develop a support system to assist with multiple roles and responsibilities. Develop a realistic perspective regarding demands and obligations of multiple roles and their completion. Eliminate depressive and anxiety symptoms associated with trying to balance multiple roles. Develop time management strategies to reduce role overload and role  conflicts. Objectives target date for all objectives is 01/11/2025: Describe role and responsibilities associated with work and family and related thoughts, feelings, and behaviors.   70% Describe at least three expectations related to work and family obligations and their relationship to gender role socialization and emotional conflict.   90% Clarify values and priorities.   80% Implement assertiveness skills and limit setting.   70% Learn and implement stress management and relaxation techniques to reduce fatigue, anxiety, and depressive symptoms.   60% Communicate needs with partner regarding multiple role obligations.   70% Learn and implement problem-solving and conflict-resolution skills.   50% Verbalize feelings of distress and dissatisfaction related to obstacles to career success. 50% Implement effective problem-solving skills to deal with a discriminatory work environment.   60% Identify and replace at least three negative self-talk statements that perpetuate feelings of powerlessness or hopelessness.   60% Articulate signs and symptoms of depression in current life experiences.   70% Identify and replace cognitive self-talk that supports depression.   70% Implement relaxation and meditation techniques to manage and process career frustrations.   60% Understand and articulate how symptoms of depression compare to current clinical and research trends of depression for women.   70% Implement behavioral interventions to overcome depression.   50% Implement assertiveness to communicate needs and desires to others.   60% Implement conflict resolution skills to alleviate interpersonal sources of depressed mood.   50% Increase social contacts and communicate needs within existing interpersonal relationships.   60% Increase the level of physical exercise.   60% Express thoughts and feelings regarding the relationship in a direct manner.   60% Both partners identify and verbalize at least three  expectations for the relationship. 50% Utilize at least two new conflict-resolution techniques to resolve issues reasonably. 60% Verbalize the feelings associated with grieving the loss of the relationship.   50% Increase time spent in enjoyable contact with the partner.   60% Share family and childhood experiences with each other to increase understanding and empathy.   60% Intervention Direct the client to resources and social opportunities within the community; assist the client in integrating these resources into a plan to start building new social relationships. Use behavioral techniques (  e.g., education, modeling, role-playing, corrective feedback, positive reinforcement) to teach the couple problem-solving and conflict-resolution skills, including defining the problem constructively and specifically, brainstorming options, compromise, choosing options, implementing a plan, and evaluating the results. Teach conflict-resolution techniques such as Do's & Don't's List and Fair Fighting Steps in The Intimate Enemy: How to Fight Fair in Love and Marriage  by Selmer and Conley; encourage the couple to practice these techniques in session and at home. Explore the family-of-origin history of each partner to discover patterns of destructive intimate relationship interactions that are being repeated in the present relationship; explore characteristics of a healthy relationship. Assist the client in identifying a clear verbal or behavioral signal to be used by either partner to terminate interaction immediately if either senses impending abuse. Explore with the client her multiple roles and responsibilities associated with work and family; clarify related thoughts and feelings. Explore with the client her expectations regarding the balance between work and family, and discuss how this relates to internalized gender role stereotypes. Discuss the client's potential feelings of guilt, shame, and anxiety  associated with not meeting internal or external expectations. Assist the client with identifying those expectations that are helpful and those that are potentially harmful; help the client discard those that are causing undue stress and strain and replace them with more realistic messages. Assign the client to read about progressive muscle relaxation and other calming strategies in relevant books or treatment manuals (e.g., Progressive Relaxation Training by Thornell and Elmer; Mastery of Your Anxiety and Optometrist by Venson Richarda Jonne armin Valorie). Assist the client in identifying conflicts that can be addressed using communication, conflict-resolution, and/or problem-solving skills (see Behavioral Marital Therapy by [facility] and Veatrice in the Handbook of Family Therapy, 2nd ed. by Nevelyn and Knickerson [Eds.]). Use behavioral techniques (i.e., education, modeling, role-playing, corrective feedback, positive reinforcement) to teach communication skills, including assertive communication, offering positive feedback, active listening, making positive requests of others for behavior change, and giving negative feedback in an honest and respectful manner. Explore, along with the client, strategies for making her life more manageable and less stressful (e.g., waking up before the children to exercise or have a cup of tea, pack the children's lunches and backpacks the night before). Encourage the client to develop a schedule of roles and responsibilities with her partner; assign her to meet regularly with her partner to discuss, review, and revise the schedule as needed. Help the client to develop a realistic schedule that outlines the responsibilities of her partner and family members; help the client enlist the commitment of all individuals to the schedule. Refer and encourage the client to join relevant support groups (e.g., single mothers, overextended women, time  management). Facilitate the client's development of a social support network to assist with multiple roles and responsibilities; develop a list of active support behaviors (e.g., babysitting, help with cleaning). Educate the client about, and encourage her to attend to, healthy eating, sleeping, and exercise. Explore the client's perception of her career success obstacles, including both overt and covert discriminatory practices related to gender, race/ethnicity, religion, and sexual orientation. Validate the client's experience and explore how the work environment impacts distress and dissatisfaction; assist her in listing the three most frustrating aspects of her employment. Teach the client effective problem-solving skills (e.g., clarify the problem, brainstorm solutions, consult with others for additional solutions, list the pros and cons of each solution, select and implement a plan of action, evaluate the consequences, redirect  efforts if necessary) to deal with  those aspects of the work environment that limit career development and participation. Reinforce the client's positive, realistic cognitive messages that enhance self-confidence and increase proactive career pursuits. Refer the client to shadowing or mentorship programs/workshops for aspiring career women. Refer the client to educational seminars, informational meetings, and other related activities to meet other career women and learn career success strategies. Encourage the client to share her feelings of depression to gain an insight into precipitating events and implications of symptoms; normalize her feelings of depression. Encourage the client to describe current and childhood experiences associated with key relationships (e.g., family-of-origin, peer and school relationships, dating relationships, female role models, extended family relationships), roles (e.g., partner, caretaker, worker, consulting civil engineer), and cultural experiences (e.g.,  gender roles) that may contribute to depression. Assign the client to participate as fully as possible in a healthy exercise regimen. Assist the client to consider upcoming stressors; brainstorm with her 10 ways to mitigate depression in the future. Encourage the client to continue participation in positive social support systems. Offer the client booster sessions as necessary. Encourage the client to discuss cognitive distortions, including automatic thoughts (e.g., negative view of self, future, experience) and negative schemas (e.g., core beliefs about self and others based on earlier childhood experiences); assess frequency of negative self-statements associated with depression. Educate the client on available options to supplement counseling along with potential implications (e.g., couples and family therapy, support networks, financial support). Reinforce the client's positive, reality-based cognitive messages that enhance self-confidence and increase adaptive action (see Positive Self-Talk in the Adult Psychotherapy Homework Planner, 2nd ed. by Jenniffer). Do behavioral experiments in which depressive automatic thoughts are treated as hypotheses/predictions, reality-based alternative hypotheses/ predictions are generated, and both are tested against the client's past, present, and/or future experiences.  Diagnosis:Adjustment disorder with mixed anxiety and depressed mood  Anxiety and depression  Plan:  -educate husband on what you experience when having a seizure -meet again on Tuesday, July 25, 2023 at [employer]. "

## 2024-07-24 ENCOUNTER — Ambulatory Visit: Admitting: Professional

## 2024-07-31 ENCOUNTER — Ambulatory Visit: Admitting: Professional

## 2024-07-31 ENCOUNTER — Encounter: Payer: Self-pay | Admitting: Professional

## 2024-07-31 DIAGNOSIS — F4323 Adjustment disorder with mixed anxiety and depressed mood: Secondary | ICD-10-CM

## 2024-08-07 ENCOUNTER — Ambulatory Visit: Admitting: Professional

## 2024-08-20 ENCOUNTER — Ambulatory Visit: Admitting: Professional

## 2024-08-21 ENCOUNTER — Ambulatory Visit: Admitting: Professional

## 2024-09-04 ENCOUNTER — Ambulatory Visit: Admitting: Professional

## 2024-09-18 ENCOUNTER — Ambulatory Visit: Admitting: Professional
